# Patient Record
Sex: Female | Born: 1960 | Race: Black or African American | Hispanic: No | Marital: Single | Smoking: Never smoker
Health system: Southern US, Community
[De-identification: ages and names within clinical notes are randomized; demographics above are authoritative.]

## PROBLEM LIST (undated history)

## (undated) DIAGNOSIS — G629 Polyneuropathy, unspecified: Secondary | ICD-10-CM

## (undated) DIAGNOSIS — N6459 Other signs and symptoms in breast: Secondary | ICD-10-CM

## (undated) DIAGNOSIS — G2581 Restless legs syndrome: Secondary | ICD-10-CM

## (undated) DIAGNOSIS — I10 Essential (primary) hypertension: Secondary | ICD-10-CM

## (undated) DIAGNOSIS — M1990 Unspecified osteoarthritis, unspecified site: Secondary | ICD-10-CM

## (undated) DIAGNOSIS — D649 Anemia, unspecified: Secondary | ICD-10-CM

## (undated) DIAGNOSIS — K5900 Constipation, unspecified: Secondary | ICD-10-CM

## (undated) DIAGNOSIS — E119 Type 2 diabetes mellitus without complications: Secondary | ICD-10-CM

## (undated) HISTORY — PX: ESOPHAGOGASTRODUODENOSCOPY ENDOSCOPY: SHX5814

## (undated) HISTORY — PX: COLONOSCOPY: SHX174

## (undated) HISTORY — PX: BREAST EXCISIONAL BIOPSY: SUR124

---

## 1998-11-18 ENCOUNTER — Other Ambulatory Visit: Admission: RE | Admit: 1998-11-18 | Discharge: 1998-11-18 | Payer: Self-pay | Admitting: Gynecology

## 1999-06-18 ENCOUNTER — Emergency Department (HOSPITAL_COMMUNITY): Admission: EM | Admit: 1999-06-18 | Discharge: 1999-06-18 | Payer: Self-pay | Admitting: Emergency Medicine

## 2000-04-19 ENCOUNTER — Other Ambulatory Visit: Admission: RE | Admit: 2000-04-19 | Discharge: 2000-04-19 | Payer: Self-pay | Admitting: Gynecology

## 2000-06-13 ENCOUNTER — Encounter: Admission: RE | Admit: 2000-06-13 | Discharge: 2000-09-11 | Payer: Self-pay | Admitting: Endocrinology

## 2001-02-16 ENCOUNTER — Encounter: Admission: RE | Admit: 2001-02-16 | Discharge: 2001-05-17 | Payer: Self-pay | Admitting: Endocrinology

## 2003-08-23 ENCOUNTER — Other Ambulatory Visit: Admission: RE | Admit: 2003-08-23 | Discharge: 2003-08-23 | Payer: Self-pay | Admitting: Gynecology

## 2005-05-25 ENCOUNTER — Other Ambulatory Visit: Admission: RE | Admit: 2005-05-25 | Discharge: 2005-05-25 | Payer: Self-pay | Admitting: Gynecology

## 2008-04-09 ENCOUNTER — Other Ambulatory Visit: Admission: RE | Admit: 2008-04-09 | Discharge: 2008-04-09 | Payer: Self-pay | Admitting: Gynecology

## 2008-10-01 ENCOUNTER — Ambulatory Visit: Payer: Self-pay | Admitting: Gynecology

## 2011-09-28 ENCOUNTER — Other Ambulatory Visit: Payer: Self-pay | Admitting: Family Medicine

## 2011-09-28 DIAGNOSIS — Z1231 Encounter for screening mammogram for malignant neoplasm of breast: Secondary | ICD-10-CM

## 2011-10-19 ENCOUNTER — Ambulatory Visit
Admission: RE | Admit: 2011-10-19 | Discharge: 2011-10-19 | Disposition: A | Discharge status: Home / Self Care | Payer: Private Health Insurance - Indemnity | Source: Ambulatory Visit | Attending: Family Medicine | Admitting: Family Medicine

## 2011-10-19 DIAGNOSIS — Z1231 Encounter for screening mammogram for malignant neoplasm of breast: Secondary | ICD-10-CM

## 2011-11-30 ENCOUNTER — Ambulatory Visit (HOSPITAL_BASED_OUTPATIENT_CLINIC_OR_DEPARTMENT_OTHER)
Admission: RE | Admit: 2011-11-30 | Discharge: 2011-11-30 | Disposition: A | Discharge status: Home / Self Care | Payer: Private Health Insurance - Indemnity | Source: Ambulatory Visit | Attending: Family Medicine | Admitting: Family Medicine

## 2011-11-30 ENCOUNTER — Other Ambulatory Visit (HOSPITAL_BASED_OUTPATIENT_CLINIC_OR_DEPARTMENT_OTHER): Payer: Self-pay | Admitting: Family Medicine

## 2011-11-30 ENCOUNTER — Ambulatory Visit (INDEPENDENT_AMBULATORY_CARE_PROVIDER_SITE_OTHER)
Admission: RE | Admit: 2011-11-30 | Discharge: 2011-11-30 | Disposition: A | Discharge status: Home / Self Care | Payer: Private Health Insurance - Indemnity | Source: Ambulatory Visit | Attending: Family Medicine | Admitting: Family Medicine

## 2011-11-30 DIAGNOSIS — M2550 Pain in unspecified joint: Secondary | ICD-10-CM | POA: Insufficient documentation

## 2011-11-30 DIAGNOSIS — M25569 Pain in unspecified knee: Secondary | ICD-10-CM

## 2011-11-30 DIAGNOSIS — W19XXXA Unspecified fall, initial encounter: Secondary | ICD-10-CM

## 2012-09-06 ENCOUNTER — Other Ambulatory Visit: Payer: Self-pay | Admitting: Family Medicine

## 2012-09-06 DIAGNOSIS — Z1231 Encounter for screening mammogram for malignant neoplasm of breast: Secondary | ICD-10-CM

## 2012-11-06 ENCOUNTER — Ambulatory Visit
Admission: RE | Admit: 2012-11-06 | Discharge: 2012-11-06 | Disposition: A | Discharge status: Home / Self Care | Payer: Private Health Insurance - Indemnity | Source: Ambulatory Visit | Attending: Family Medicine | Admitting: Family Medicine

## 2012-11-06 DIAGNOSIS — Z1231 Encounter for screening mammogram for malignant neoplasm of breast: Secondary | ICD-10-CM

## 2013-01-20 HISTORY — PX: EYE SURGERY: SHX253

## 2013-08-20 HISTORY — PX: EYE SURGERY: SHX253

## 2013-09-13 ENCOUNTER — Other Ambulatory Visit: Payer: Self-pay

## 2013-09-13 DIAGNOSIS — Z1231 Encounter for screening mammogram for malignant neoplasm of breast: Secondary | ICD-10-CM

## 2013-11-07 ENCOUNTER — Ambulatory Visit
Admission: RE | Admit: 2013-11-07 | Discharge: 2013-11-07 | Disposition: A | Discharge status: Home / Self Care | Payer: Private Health Insurance - Indemnity | Source: Ambulatory Visit

## 2013-11-07 DIAGNOSIS — Z1231 Encounter for screening mammogram for malignant neoplasm of breast: Secondary | ICD-10-CM

## 2014-10-10 ENCOUNTER — Other Ambulatory Visit: Payer: Self-pay

## 2014-10-10 DIAGNOSIS — Z1231 Encounter for screening mammogram for malignant neoplasm of breast: Secondary | ICD-10-CM

## 2014-11-08 ENCOUNTER — Ambulatory Visit
Admission: RE | Admit: 2014-11-08 | Discharge: 2014-11-08 | Disposition: A | Discharge status: Home / Self Care | Payer: Managed Care, Other (non HMO) | Source: Ambulatory Visit

## 2014-11-08 DIAGNOSIS — Z1231 Encounter for screening mammogram for malignant neoplasm of breast: Secondary | ICD-10-CM

## 2015-12-16 ENCOUNTER — Other Ambulatory Visit: Payer: Self-pay

## 2015-12-16 DIAGNOSIS — Z1231 Encounter for screening mammogram for malignant neoplasm of breast: Secondary | ICD-10-CM

## 2015-12-31 ENCOUNTER — Ambulatory Visit
Admission: RE | Admit: 2015-12-31 | Discharge: 2015-12-31 | Disposition: A | Discharge status: Home / Self Care | Payer: BLUE CROSS/BLUE SHIELD | Source: Ambulatory Visit

## 2015-12-31 DIAGNOSIS — Z1231 Encounter for screening mammogram for malignant neoplasm of breast: Secondary | ICD-10-CM

## 2016-01-01 ENCOUNTER — Other Ambulatory Visit: Payer: Self-pay | Admitting: Family Medicine

## 2016-01-01 ENCOUNTER — Other Ambulatory Visit: Payer: Self-pay

## 2016-01-01 DIAGNOSIS — R928 Other abnormal and inconclusive findings on diagnostic imaging of breast: Secondary | ICD-10-CM

## 2016-01-07 ENCOUNTER — Other Ambulatory Visit: Payer: Self-pay | Admitting: Family Medicine

## 2016-01-07 ENCOUNTER — Ambulatory Visit
Admission: RE | Admit: 2016-01-07 | Discharge: 2016-01-07 | Disposition: A | Discharge status: Home / Self Care | Payer: No Typology Code available for payment source | Source: Ambulatory Visit | Attending: Family Medicine | Admitting: Family Medicine

## 2016-01-07 DIAGNOSIS — R928 Other abnormal and inconclusive findings on diagnostic imaging of breast: Secondary | ICD-10-CM

## 2016-01-07 DIAGNOSIS — N6320 Unspecified lump in the left breast, unspecified quadrant: Secondary | ICD-10-CM

## 2016-01-08 ENCOUNTER — Ambulatory Visit
Admission: RE | Admit: 2016-01-08 | Discharge: 2016-01-08 | Disposition: A | Discharge status: Home / Self Care | Payer: No Typology Code available for payment source | Source: Ambulatory Visit | Attending: Family Medicine | Admitting: Family Medicine

## 2016-01-08 ENCOUNTER — Other Ambulatory Visit: Payer: Self-pay | Admitting: Family Medicine

## 2016-01-08 DIAGNOSIS — N6320 Unspecified lump in the left breast, unspecified quadrant: Secondary | ICD-10-CM

## 2016-01-08 HISTORY — PX: BREAST BIOPSY: SHX20

## 2017-01-31 ENCOUNTER — Other Ambulatory Visit: Payer: Self-pay | Admitting: Family Medicine

## 2017-01-31 ENCOUNTER — Other Ambulatory Visit: Payer: Self-pay | Admitting: Oncology

## 2017-01-31 DIAGNOSIS — N63 Unspecified lump in unspecified breast: Secondary | ICD-10-CM

## 2017-02-03 ENCOUNTER — Ambulatory Visit
Admission: RE | Admit: 2017-02-03 | Discharge: 2017-02-03 | Disposition: A | Discharge status: Home / Self Care | Payer: Managed Care, Other (non HMO) | Source: Ambulatory Visit | Attending: Oncology | Admitting: Oncology

## 2017-02-03 ENCOUNTER — Ambulatory Visit
Admission: RE | Admit: 2017-02-03 | Discharge: 2017-02-03 | Disposition: A | Discharge status: Home / Self Care | Payer: Self-pay | Source: Ambulatory Visit | Attending: Oncology | Admitting: Oncology

## 2017-02-03 DIAGNOSIS — N63 Unspecified lump in unspecified breast: Secondary | ICD-10-CM

## 2017-02-11 ENCOUNTER — Ambulatory Visit: Payer: Self-pay | Admitting: Surgery

## 2017-02-11 DIAGNOSIS — N6320 Unspecified lump in the left breast, unspecified quadrant: Secondary | ICD-10-CM

## 2017-02-11 NOTE — H&P (Signed)
History of Present Illness Tricia Nelson. Tricia Hoopes MD; 02/11/2017 10:56 AM) The patient is a 56 year old female who presents with a breast mass. Referred by Tricia Castle, NP for left breast mass  This is a 57 year old female with a family history of breast cancer in her mother who passed away at age 67 who presents with a palpable mass in her upper inner quadrant of her left breast. January 2017, the patient underwent routine screening mammogram. This showed a suspicious area in the upper inner quadrant of her left breast. She underwent further workup with mammogram and ultrasound as well as biopsy showing fibrocystic changes and chronic inflammation. However, follow-up mammogram on 02/03/17 showed that this mass is larger and has become more spiculated. A biopsy clip is in place. She presents now to discuss excision.   CLINICAL DATA: Screening.  EXAM: DIGITAL SCREENING BILATERAL MAMMOGRAM WITH CAD  COMPARISON: Previous exam(s).  ACR Breast Density Category c: The breast tissue is heterogeneously dense, which may obscure small masses.  FINDINGS: In the left breast, a possible mass warrants further evaluation. In the right breast, no findings suspicious for malignancy.  Images were processed with CAD.  IMPRESSION: Further evaluation is suggested for possible mass in the left breast.  RECOMMENDATION: Diagnostic mammogram and possibly ultrasound of the left breast. (Code:[id number])  The patient will be contacted regarding the findings, and additional imaging will be scheduled.  BI-RADS CATEGORY 0: Incomplete. Need additional imaging evaluation and/or prior mammograms for comparison.   Electronically Signed By: Abelardo Diesel M.D. On: 12/31/2015 13:45  CLINICAL DATA: Recall from screening mammography. The patient does have a family history of postmenopausal breast cancer in her mother (now deceased from breast cancer).  EXAM: DIGITAL DIAGNOSTIC LEFT MAMMOGRAM WITH 3D  TOMOSYNTHESIS WITH CAD  ULTRASOUND LEFT BREAST  COMPARISON: Previous exam(s).  ACR Breast Density Category c: The breast tissue is heterogeneously dense, which may obscure small masses.  FINDINGS: There is a subtle area of developing density located within the upper inner quadrant of the left breast with possible subtle distortion. The mass is located within the posterior 1/3 of the left breast at the 11 o'clock position approximately 7 cm from the nipple.  Mammographic images were processed with CAD.  On physical exam, there is firm thickening located within the upper inner quadrant of the left breast. There is no palpable left axillary adenopathy.  Targeted ultrasound is performed, showing a subtle, irregular hypoechoic mass with shadowing and possible distortion located within the left breast at the 11 o'clock position 7 cm from the nipple measuring 2.3 x 1.9 x 1.5 cm in size. Tissue sampling is recommended. I have discussed ultrasound-guided core biopsy of this area. This will be scheduled per patient preference.  Ultrasound of the left axilla demonstrates normal axillary contents and no evidence for adenopathy. Also, ultrasound of the left internal mammary chain region demonstrates no adenopathy.  IMPRESSION: 2.3 cm irregular mass corresponding to an area of subtle developing density with possible distortion located within the left breast at the 11 o'clock position 7 cm from the nipple. Tissue sampling is recommended and ultrasound-guided core biopsy will be scheduled.  RECOMMENDATION: Left breast ultrasound-guided core biopsy.  I have discussed the findings and recommendations with the patient. Results were also provided in writing at the conclusion of the visit. If applicable, a reminder letter will be sent to the patient regarding the next appointment.  BI-RADS CATEGORY 4: Suspicious.   Electronically Signed By: Altamese Cabal M.D. On: 01/07/2016  10:01  CLINICAL DATA: Palpable mass 11 o'clock position left breast 7 cm from the nipple. Suspicious features on ultrasound.  EXAM: ULTRASOUND GUIDED LEFT BREAST CORE NEEDLE BIOPSY  COMPARISON: 01/07/2016  PROCEDURE: I met with the patient and we discussed the procedure of ultrasound-guided biopsy, including benefits and alternatives. We discussed the high likelihood of a successful procedure. We discussed the risks of the procedure including infection, bleeding, tissue injury, clip migration, and inadequate sampling. Informed written consent was given. The usual time-out protocol was performed immediately prior to the procedure.  Using sterile technique and 1% Lidocaine as local anesthetic, under direct ultrasound visualization, a 12 gauge vacuum-assisted device was used to perform biopsy of an irregular hypoechoic mass at 11 o'clock position left breast using a medial to lateral approach. At the conclusion of the procedure, a ribbon shaped tissue marker clip was deployed into the biopsy cavity. Follow-up 2-view mammogram was performed and dictated separately.  IMPRESSION: Ultrasound-guided biopsy of left breast mass. No apparent complications.  Electronically Signed: By: Susan Turner M.D. On: 01/08/2016 16:30  ADDENDUM REPORT: 01/09/2016 11:13 ADDENDUM: Pathology revealed fibrocystic changes with chronic inflammation and fibrosis in the left breast. This was found to be concordant by Dr. Susan Turner. Pathology was discussed with the patient by telephone. She reported doing well after the biopsy. Post biopsy instructions and care were reviewed and her questions were answered. She was encouraged to call [facility] for any additional concerns. She was asked to return for left diagnostic mammography and possible ultrasound in 6 months. Pathology results reported by Leigh Kuhnly RN, BSN on January 09, 2016. Electronically Signed By: Susan  Turner M.D. On: 01/09/2016 11:13  CLINICAL DATA: Ultrasound-guided biopsy was performed of the left breast.  EXAM: DIAGNOSTIC LEFT MAMMOGRAM POST ULTRASOUND BIOPSY  COMPARISON: Previous exam(s).  FINDINGS: Mammographic images were obtained following ultrasound guided biopsy of a left breast mass in the upper inner. A ribbon shaped biopsy clip is satisfactorily positioned within the developing density of the upper inner left breast.  IMPRESSION: Satisfactory position of ribbon shaped biopsy clip in the left breast.  Final Assessment: Post Procedure Mammograms for Marker Placement   Electronically Signed By: Susan Turner M.D. On: 01/08/2016 16:31  CLINICAL DATA: Followup after benign ultrasound guided biopsy of a left breast mass with pathology revealing inflammation and fibrosis. The patient states there is a persistent palpable mass in the upper inner left breast which she feels has increased in size over time.  EXAM: 2D DIGITAL DIAGNOSTIC BILATERAL MAMMOGRAM WITH CAD AND ADJUNCT TOMO  COMPARISON: Previous exam(s).  ACR Breast Density Category c: The breast tissue is heterogeneously dense, which may obscure small masses.  FINDINGS: No suspicious masses or calcifications are seen in the right breast. A ribbon shaped biopsy marking clip is present at site of prior benign left breast biopsy. The mass in the upper inner left breast appears irregular and now spiculated in appearance measuring approximately 1.8 cm.  Mammographic images were processed with CAD.  Physical examination of the upper inner left breast reveals a firm palpable mass at the approximate 11 o'clock position.  IMPRESSION: Left breast mass at 11 o'clock previously biopsied with pathology demonstrating fibrosis and inflammation. Given the irregular spiculated appearance of this mass, surgical excision is recommended.  RECOMMENDATION: Recommend surgical excision of the mass in the upper  inner posterior left breast at site of ribbon shaped biopsy marking clip. Surgical consultation will be arranged for the patient.  I have discussed the findings and recommendations   with the patient. Results were also provided in writing at the conclusion of the visit. If applicable, a reminder letter will be sent to the patient regarding the next appointment.  BI-RADS CATEGORY 0: Incomplete. Need additional imaging evaluation and/or prior mammograms for comparison.   Electronically Signed By: Everlean Alstrom M.D. On: 02/03/2017 08:37    Past Surgical History Patsey Berthold, [place]; 02/11/2017 10:03 AM) Breast Biopsy Left. Cataract Surgery Bilateral.  Diagnostic Studies History Patsey Berthold, [place]; 02/11/2017 10:03 AM) Colonoscopy 1-5 years ago Mammogram within last year Pap Smear >5 years ago  Allergies Patsey Berthold, [place]; 02/11/2017 10:04 AM) No Known Drug Allergies 02/11/2017  Medication History Patsey Berthold, CMA; 02/11/2017 10:04 AM) Carlyn Reichert (50-1000MG Tablet, Oral) Active. Jardiance (25MG Tablet, Oral) Active. Simvastatin (20MG Tablet, Oral) Active. Lisinopril (10MG Tablet, Oral) Active. Medications Reconciled  Social History Patsey Berthold, CMA; 02/11/2017 10:04 AM) Alcohol use Occasional alcohol use. Caffeine use Coffee. No drug use Tobacco use Never smoker.  Family History Patsey Berthold, [place]; 02/11/2017 10:04 AM) Alcohol Abuse Family Members In General, Father. Breast Cancer Mother. Colon Cancer Family Members In General. Depression Sister. Diabetes Mellitus Father, Sister. Heart Disease Family Members In General. Hypertension Family Members In General, Mother.  Pregnancy / Birth History Patsey Berthold, [place]; 02/11/2017 10:04 AM) Age at menarche 71 years. Contraceptive History Oral contraceptives. Gravida 3 Irregular periods Maternal age 83-20  Other Problems Patsey Berthold, [place]; 02/11/2017 10:04 AM) Diabetes  [place] Gastroesophageal Reflux Disease High blood pressure     Review of Systems Patsey Berthold CMA; 02/11/2017 10:04 AM) General Not Present- Appetite Loss, Chills, Fatigue, Fever, Night Sweats, Weight Gain and Weight Loss. Skin Not Present- Change in Wart/Mole, Dryness, Hives, Jaundice, New Lesions, Non-Healing Wounds, Rash and Ulcer. HEENT Present- Wears glasses/contact lenses. Not Present- Earache, Hearing Loss, Hoarseness, Nose Bleed, Oral Ulcers, Ringing in the Ears, Seasonal Allergies, Sinus Pain, Sore Throat, Visual Disturbances and Yellow Eyes. Respiratory Not Present- Bloody sputum, Chronic Cough, Difficulty Breathing, Snoring and Wheezing. Breast Present- Breast Mass. Not Present- Breast Pain, Nipple Discharge and Skin Changes. Cardiovascular Not Present- Chest Pain, Difficulty Breathing Lying Down, Leg Cramps, Palpitations, Rapid Heart Rate, Shortness of Breath and Swelling of Extremities. Gastrointestinal Present- Difficulty Swallowing and Gets full quickly at meals. Not Present- Abdominal Pain, Bloating, Bloody Stool, Change in Bowel Habits, Chronic diarrhea, Constipation, Excessive gas, Hemorrhoids, Indigestion, Nausea, Rectal Pain and Vomiting. Female Genitourinary Not Present- Frequency, Nocturia, Painful Urination, Pelvic Pain and Urgency. Musculoskeletal Present- Back Pain. Not Present- Joint Pain, Joint Stiffness, Muscle Pain, Muscle Weakness and Swelling of Extremities. Psychiatric Not Present- Anxiety, Bipolar, Change in Sleep Pattern, Depression, Fearful and Frequent crying. Endocrine Not Present- Cold Intolerance, Excessive Hunger, Hair Changes, Heat Intolerance, Hot flashes and New Diabetes. Hematology Not Present- Blood Thinners, Easy Bruising, Excessive bleeding, Gland problems, HIV and Persistent Infections.  Vitals Patsey Berthold CMA; 02/11/2017 10:05 AM) 02/11/2017 10:04 AM Weight: 187.4 lb Height: 64in Height was reported by patient. Body Surface Area:  1.9 m Body Mass Index: 32.17 kg/m  Temp.: 98.62F  Pulse: 84 (Regular)  BP: 134/78 (Sitting, Left Arm, Standard)      Physical Exam Rodman Key K. Gavyn Zoss MD; 02/11/2017 10:58 AM)  The physical exam findings are as follows: Note:WDWN in NAD Eyes: Pupils equal, round; sclera anicteric HENT: Oral mucosa moist; good dentition Neck: No masses palpated, no thyromegaly Lungs: CTA bilaterally; normal respiratory effort Breasts: symmetric; bilateral retracted nipples - chronic No axillary lymphadenopathy; no right breast masses Left upper inner quadrant - indistinct firmness about 5-7 cm  from edge of nipple at 10-11:00 CV: Regular rate and rhythm; no murmurs; extremities well-perfused with no edema Abd: +bowel sounds, soft, non-tender, no palpable organomegaly; no palpable hernias Skin: Warm, dry; no sign of jaundice Psychiatric - alert and oriented x 4; calm mood and affect    Assessment & Plan Rodman Key K. Romar Woodrick MD; 02/11/2017 10:59 AM)  MASS OF UPPER INNER QUADRANT OF LEFT BREAST (N63.22)  Current Plans Pt Education - [facility] Breast Cancer Information Given - [facility] "Breast Journey" Package Schedule for Surgery - Left radioactive seed localized lumpectomy. The surgical procedure has been discussed with the patient. Potential risks, benefits, alternative treatments, and expected outcomes have been explained. All of the patient's questions at this time have been answered. The likelihood of reaching the patient's treatment goal is good. The patient understand the proposed surgical procedure and wishes to proceed. Note:She did not undergo a repeat core biopsy as the previous biopsy was negative. A negative biopsy at this time would not change our surgical plan other than the possibility of sentinel lymph node biopsy.  I spent approximately 30 minutes of additional time with the patient discussing her surgical options. We discussed the usual pathway of a preoperative core biopsy to  help plan lumpectomy with or without sentinel lymph node biopsy. However, with a previous biopsy that was negative for malignancy, a negative repeat biopsy would not change our plan to excise this area. She understands and wishes to proceed with radioactive seed localization lumpectomy.  Tricia Nelson. Georgette Dover, MD, [facility]  General/ Trauma Surgery  02/11/2017 11:00 AM

## 2017-02-16 ENCOUNTER — Other Ambulatory Visit: Payer: Self-pay | Admitting: Surgery

## 2017-02-16 DIAGNOSIS — N6320 Unspecified lump in the left breast, unspecified quadrant: Secondary | ICD-10-CM

## 2017-02-17 ENCOUNTER — Other Ambulatory Visit (HOSPITAL_COMMUNITY): Payer: Self-pay | Admitting: *Deleted

## 2017-02-17 NOTE — Pre-Procedure Instructions (Addendum)
Tricia Nelson  02/17/2017    Your procedure is scheduled on Tuesday, February 22, 2017 at 9:15 AM.   Report to [facility] Entrance "A" Admitting Office at 7:15 AM.   Call this number if you have problems the morning of surgery: [phone]   Questions prior to day of surgery, please call [phone] between 8 & 4 PM.   Remember:  Do not eat food or drink liquids after midnight Monday, 02/21/17.  You may use your eye drops the morning of surgery. Stop the Biotin with Vit C and E as of today. Do not use Aspirin products or NSAIDS (Ibuprofen, Aleve, etc) prior to surgery.  Drink the 8 ounce water 2 hours prior to your arrival time day of surgery. Drink it at 5:15 AM.    How to Manage Your Diabetes Before Surgery   Why is it important to control my blood sugar before and after surgery?   Improving blood sugar levels before and after surgery helps healing and can limit problems.  A way of improving blood sugar control is eating a healthy diet by:  - Eating less sugar and carbohydrates  - Increasing activity/exercise  - Talk with your doctor about reaching your blood sugar goals  High blood sugars (greater than 180 mg/dL) can raise your risk of infections and slow down your recovery so you will need to focus on controlling your diabetes during the weeks before surgery.  Make sure that the doctor who takes care of your diabetes knows about your planned surgery including the date and location.  How do I manage my blood sugars before surgery?   Check your blood sugar at least 4 times a day, 2 days before surgery to make sure that they are not too high or low.  Check your blood sugar the morning of your surgery when you wake up and every 2 hours until you get to the Short-Stay unit.  Treat a low blood sugar (less than 70 mg/dL) with 1/2 cup of clear juice (cranberry or apple), 4 glucose tablets, OR glucose gel.  Recheck blood sugar in 15 minutes after treatment (to make  sure it is greater than 70 mg/dL).  If blood sugar is not greater than 70 mg/dL on re-check, call [phone] for further instructions.   Report your blood sugar to the Short-Stay nurse when you get to Short-Stay.  References:  [facility], 2007 "How to Manage your Diabetes Before and After Surgery".  What do I do about my diabetes medications?   Do not take oral diabetes medicines (pills) the morning of surgery.   Do not wear jewelry, make-up or nail polish.  Do not wear lotions, powders, perfumes or deodorant.  Do not shave 48 hours prior to surgery.    Do not bring valuables to the hospital.  [facility] is not responsible for any belongings or valuables.  Contacts, dentures or bridgework may not be worn into surgery.  Leave your suitcase in the car.  After surgery it may be brought to your room.  For patients admitted to the hospital, discharge time will be determined by your treatment team.  Patients discharged the day of surgery will not be allowed to drive home.   Special instructions:  [place] - Preparing for Surgery  Before surgery, you can play an important role.  Because skin is not sterile, your skin needs to be as free of germs as possible.  You can reduce the number of  germs on you skin by washing with CHG (chlorahexidine gluconate) soap before surgery.  CHG is an antiseptic cleaner which kills germs and bonds with the skin to continue killing germs even after washing.  Please DO NOT use if you have an allergy to CHG or antibacterial soaps.  If your skin becomes reddened/irritated stop using the CHG and inform your nurse when you arrive at Short Stay.  Do not shave (including legs and underarms) for at least 48 hours prior to the first CHG shower.  You may shave your face.  Please follow these instructions carefully:   1.  Shower with CHG Soap the night before surgery and the                    morning of Surgery.  2.  If you choose to  wash your hair, wash your hair first as usual with your       normal shampoo.  3.  After you shampoo, rinse your hair and body thoroughly to remove the shampoo.  4.  Use CHG as you would any other liquid soap.  You can apply chg directly       to the skin and wash gently with scrungie or a clean washcloth.  5.  Apply the CHG Soap to your body ONLY FROM THE NECK DOWN.        Do not use on open wounds or open sores.  Avoid contact with your eyes, ears, mouth and genitals (private parts).  Wash genitals (private parts) with your normal soap.  6.  Wash thoroughly, paying special attention to the area where your surgery        will be performed.  7.  Thoroughly rinse your body with warm water from the neck down.  8.  DO NOT shower/wash with your normal soap after using and rinsing off       the CHG Soap.  9.  Pat yourself dry with a clean towel.            10.  Wear clean pajamas.            11.  Place clean sheets on your bed the night of your first shower and do not        sleep with pets.  Day of Surgery  Do not apply any lotions/deodorants the morning of surgery.  Please wear clean clothes to the hospital.   Please read over the fact sheets that you were given.

## 2017-02-18 ENCOUNTER — Ambulatory Visit
Admission: RE | Admit: 2017-02-18 | Discharge: 2017-02-18 | Disposition: A | Discharge status: Home / Self Care | Payer: 59 | Source: Ambulatory Visit | Attending: Surgery | Admitting: Surgery

## 2017-02-18 ENCOUNTER — Encounter (HOSPITAL_COMMUNITY): Payer: Self-pay | Admitting: *Deleted

## 2017-02-18 ENCOUNTER — Encounter (HOSPITAL_COMMUNITY)
Admission: RE | Admit: 2017-02-18 | Discharge: 2017-02-18 | Disposition: A | Discharge status: Home / Self Care | Payer: 59 | Source: Ambulatory Visit | Attending: Surgery | Admitting: Surgery

## 2017-02-18 DIAGNOSIS — N6320 Unspecified lump in the left breast, unspecified quadrant: Secondary | ICD-10-CM

## 2017-02-18 DIAGNOSIS — E119 Type 2 diabetes mellitus without complications: Secondary | ICD-10-CM | POA: Diagnosis not present

## 2017-02-18 DIAGNOSIS — I10 Essential (primary) hypertension: Secondary | ICD-10-CM | POA: Diagnosis not present

## 2017-02-18 DIAGNOSIS — Z79899 Other long term (current) drug therapy: Secondary | ICD-10-CM | POA: Insufficient documentation

## 2017-02-18 DIAGNOSIS — D649 Anemia, unspecified: Secondary | ICD-10-CM | POA: Diagnosis not present

## 2017-02-18 DIAGNOSIS — Z01812 Encounter for preprocedural laboratory examination: Secondary | ICD-10-CM | POA: Insufficient documentation

## 2017-02-18 DIAGNOSIS — Z01810 Encounter for preprocedural cardiovascular examination: Secondary | ICD-10-CM | POA: Diagnosis not present

## 2017-02-18 HISTORY — DX: Restless legs syndrome: G25.81

## 2017-02-18 HISTORY — DX: Unspecified osteoarthritis, unspecified site: M19.90

## 2017-02-18 HISTORY — DX: Polyneuropathy, unspecified: G62.9

## 2017-02-18 HISTORY — DX: Essential (primary) hypertension: I10

## 2017-02-18 HISTORY — DX: Constipation, unspecified: K59.00

## 2017-02-18 HISTORY — DX: Type 2 diabetes mellitus without complications: E11.9

## 2017-02-18 HISTORY — DX: Anemia, unspecified: D64.9

## 2017-02-18 LAB — CBC
HCT: 36.8 % (ref 36.0–46.0)
Hemoglobin: 11.8 g/dL — ABNORMAL LOW (ref 12.0–15.0)
MCH: 25.4 pg — AB (ref 26.0–34.0)
MCHC: 32.1 g/dL (ref 30.0–36.0)
MCV: 79.3 fL (ref 78.0–100.0)
PLATELETS: 264 10*3/uL (ref 150–400)
RBC: 4.64 MIL/uL (ref 3.87–5.11)
RDW: 15.1 % (ref 11.5–15.5)
WBC: 5.7 10*3/uL (ref 4.0–10.5)

## 2017-02-18 LAB — BASIC METABOLIC PANEL
Anion gap: 8 (ref 5–15)
BUN: 15 mg/dL (ref 6–20)
CALCIUM: 9.3 mg/dL (ref 8.9–10.3)
CO2: 24 mmol/L (ref 22–32)
CREATININE: 1.03 mg/dL — AB (ref 0.44–1.00)
Chloride: 108 mmol/L (ref 101–111)
GFR calc non Af Amer: >60 mL/min (ref >60)
Glucose, Bld: 143 mg/dL — ABNORMAL HIGH (ref 65–99)
Potassium: 5.3 mmol/L — ABNORMAL HIGH (ref 3.5–5.1)
Sodium: 140 mmol/L (ref 135–145)

## 2017-02-18 LAB — GLUCOSE, CAPILLARY: GLUCOSE-CAPILLARY: 131 mg/dL — AB (ref 65–99)

## 2017-02-18 NOTE — Progress Notes (Signed)
Pt denies cardiac history, chest pain or sob. Pt is a diabetic, type 2. Pt's last A1C was 7.7 on 10/22/16. Will have one done today. States fasting blood sugars are usually between 150-170. Today's CBG was fasting and was 131. Pt given 8 oz bottle of water for ERAS.

## 2017-02-19 LAB — HEMOGLOBIN A1C
Hgb A1c MFr Bld: 7.9 % — ABNORMAL HIGH (ref 4.8–5.6)
MEAN PLASMA GLUCOSE: 180 mg/dL

## 2017-02-21 NOTE — Progress Notes (Signed)
Anesthesia Chart Review:  Pt is a 56 year old female scheduled for L breast lumpectomy with radioactive seed localization on 02/22/2017 with Manus RuddMatthew Tsuei, MD.   PMH includes:  HTN, DM, anemia. Never smoker. BMI 32  Medications include: janumet, jardiance, lisinopril, simvastatin  Preoperative labs reviewed.   - K 5.3, but specimen hemolyzed. Will repeat DOS.  - HbA1c 7.9, glucose 143  EKG 02/18/17: NSR. Possible LAE.   If labs acceptable DOS, I anticipate pt can proceed as scheduled.   Rica Mastngela Kabbe, FNP-BC [facility]iology Phone: [phone] 02/21/2017 11:05 AM

## 2017-02-22 ENCOUNTER — Ambulatory Visit (HOSPITAL_COMMUNITY): Payer: 59 | Admitting: Emergency Medicine

## 2017-02-22 ENCOUNTER — Encounter (HOSPITAL_COMMUNITY): Payer: Self-pay | Admitting: *Deleted

## 2017-02-22 ENCOUNTER — Ambulatory Visit
Admission: RE | Admit: 2017-02-22 | Discharge: 2017-02-22 | Disposition: A | Discharge status: Home / Self Care | Payer: 59 | Source: Ambulatory Visit | Attending: Surgery | Admitting: Surgery

## 2017-02-22 ENCOUNTER — Ambulatory Visit (HOSPITAL_COMMUNITY)
Admission: RE | Admit: 2017-02-22 | Discharge: 2017-02-22 | Disposition: A | Discharge status: Home / Self Care | Payer: 59 | Source: Ambulatory Visit | Attending: Surgery | Admitting: Surgery

## 2017-02-22 ENCOUNTER — Encounter (HOSPITAL_COMMUNITY)
Admission: RE | Disposition: A | Discharge status: Home / Self Care | Payer: Self-pay | Source: Ambulatory Visit | Attending: Surgery

## 2017-02-22 DIAGNOSIS — Z9842 Cataract extraction status, left eye: Secondary | ICD-10-CM | POA: Diagnosis not present

## 2017-02-22 DIAGNOSIS — Z803 Family history of malignant neoplasm of breast: Secondary | ICD-10-CM | POA: Diagnosis not present

## 2017-02-22 DIAGNOSIS — N6320 Unspecified lump in the left breast, unspecified quadrant: Secondary | ICD-10-CM | POA: Diagnosis present

## 2017-02-22 DIAGNOSIS — Z79899 Other long term (current) drug therapy: Secondary | ICD-10-CM | POA: Insufficient documentation

## 2017-02-22 DIAGNOSIS — N6322 Unspecified lump in the left breast, upper inner quadrant: Secondary | ICD-10-CM | POA: Diagnosis not present

## 2017-02-22 DIAGNOSIS — I10 Essential (primary) hypertension: Secondary | ICD-10-CM | POA: Insufficient documentation

## 2017-02-22 DIAGNOSIS — Z833 Family history of diabetes mellitus: Secondary | ICD-10-CM | POA: Insufficient documentation

## 2017-02-22 DIAGNOSIS — Z8249 Family history of ischemic heart disease and other diseases of the circulatory system: Secondary | ICD-10-CM | POA: Insufficient documentation

## 2017-02-22 DIAGNOSIS — N6012 Diffuse cystic mastopathy of left breast: Secondary | ICD-10-CM | POA: Diagnosis not present

## 2017-02-22 DIAGNOSIS — K219 Gastro-esophageal reflux disease without esophagitis: Secondary | ICD-10-CM | POA: Insufficient documentation

## 2017-02-22 DIAGNOSIS — Z818 Family history of other mental and behavioral disorders: Secondary | ICD-10-CM | POA: Diagnosis not present

## 2017-02-22 DIAGNOSIS — E119 Type 2 diabetes mellitus without complications: Secondary | ICD-10-CM | POA: Diagnosis not present

## 2017-02-22 DIAGNOSIS — Z800 Family history of malignant neoplasm of digestive organs: Secondary | ICD-10-CM | POA: Diagnosis not present

## 2017-02-22 DIAGNOSIS — Z9841 Cataract extraction status, right eye: Secondary | ICD-10-CM | POA: Diagnosis not present

## 2017-02-22 DIAGNOSIS — Z811 Family history of alcohol abuse and dependence: Secondary | ICD-10-CM | POA: Diagnosis not present

## 2017-02-22 HISTORY — PX: BREAST EXCISIONAL BIOPSY: SUR124

## 2017-02-22 HISTORY — PX: BREAST LUMPECTOMY WITH RADIOACTIVE SEED LOCALIZATION: SHX6424

## 2017-02-22 LAB — POCT I-STAT 4, (NA,K, GLUC, HGB,HCT)
Glucose, Bld: 151 mg/dL — ABNORMAL HIGH (ref 65–99)
HEMATOCRIT: 38 % (ref 36.0–46.0)
Hemoglobin: 12.9 g/dL (ref 12.0–15.0)
Potassium: 4.2 mmol/L (ref 3.5–5.1)
Sodium: 142 mmol/L (ref 135–145)

## 2017-02-22 LAB — GLUCOSE, CAPILLARY
GLUCOSE-CAPILLARY: 186 mg/dL — AB (ref 65–99)
Glucose-Capillary: 140 mg/dL — ABNORMAL HIGH (ref 65–99)

## 2017-02-22 SURGERY — BREAST LUMPECTOMY WITH RADIOACTIVE SEED LOCALIZATION
Anesthesia: General | Site: Breast | Laterality: Left

## 2017-02-22 MED ORDER — ONDANSETRON HCL 4 MG/2ML IJ SOLN
INTRAMUSCULAR | Status: DC | PRN
  Administered 2017-02-22: 4 mg via INTRAVENOUS

## 2017-02-22 MED ORDER — 0.9 % SODIUM CHLORIDE (POUR BTL) OPTIME
TOPICAL | Status: DC | PRN
  Administered 2017-02-22: 1000 mL

## 2017-02-22 MED ORDER — DEXAMETHASONE SODIUM PHOSPHATE 10 MG/ML IJ SOLN
INTRAMUSCULAR | Status: AC
  Filled 2017-02-22: qty 1

## 2017-02-22 MED ORDER — PHENYLEPHRINE 40 MCG/ML (10ML) SYRINGE FOR IV PUSH (FOR BLOOD PRESSURE SUPPORT)
PREFILLED_SYRINGE | INTRAVENOUS | Status: AC
  Filled 2017-02-22: qty 10

## 2017-02-22 MED ORDER — LACTATED RINGERS IV SOLN
Freq: Once | INTRAVENOUS | Status: AC
  Administered 2017-02-22: 08:00:00 via INTRAVENOUS

## 2017-02-22 MED ORDER — DEXAMETHASONE SODIUM PHOSPHATE 10 MG/ML IJ SOLN
INTRAMUSCULAR | Status: DC | PRN
  Administered 2017-02-22: 5 mg via INTRAVENOUS

## 2017-02-22 MED ORDER — LIDOCAINE HCL (CARDIAC) 20 MG/ML IV SOLN
INTRAVENOUS | Status: DC | PRN
  Administered 2017-02-22: 100 mg via INTRAVENOUS

## 2017-02-22 MED ORDER — DIPHENHYDRAMINE HCL 50 MG/ML IJ SOLN
INTRAMUSCULAR | Status: AC
  Filled 2017-02-22: qty 1

## 2017-02-22 MED ORDER — SUGAMMADEX SODIUM 200 MG/2ML IV SOLN
INTRAVENOUS | Status: DC | PRN
  Administered 2017-02-22: 180 mg via INTRAVENOUS

## 2017-02-22 MED ORDER — KETOROLAC TROMETHAMINE 30 MG/ML IJ SOLN
INTRAMUSCULAR | Status: AC
  Filled 2017-02-22: qty 1

## 2017-02-22 MED ORDER — PHENYLEPHRINE HCL 10 MG/ML IJ SOLN
INTRAMUSCULAR | Status: DC | PRN
  Administered 2017-02-22 (×3): 80 ug via INTRAVENOUS

## 2017-02-22 MED ORDER — FENTANYL CITRATE (PF) 100 MCG/2ML IJ SOLN
50.0000 ug | Freq: Once | INTRAMUSCULAR | Status: AC
  Administered 2017-02-22: 50 ug via INTRAVENOUS

## 2017-02-22 MED ORDER — SUGAMMADEX SODIUM 200 MG/2ML IV SOLN
INTRAVENOUS | Status: AC
  Filled 2017-02-22: qty 2

## 2017-02-22 MED ORDER — MIDAZOLAM HCL 2 MG/2ML IJ SOLN
INTRAMUSCULAR | Status: AC
  Filled 2017-02-22: qty 2

## 2017-02-22 MED ORDER — HYDROMORPHONE HCL 1 MG/ML IJ SOLN: 0.2500 mg | INTRAMUSCULAR | Status: DC | PRN

## 2017-02-22 MED ORDER — CHLORHEXIDINE GLUCONATE CLOTH 2 % EX PADS: 6.0000 | MEDICATED_PAD | Freq: Once | CUTANEOUS | Status: DC

## 2017-02-22 MED ORDER — CELECOXIB 200 MG PO CAPS
400.0000 mg | ORAL_CAPSULE | ORAL | Status: AC
  Administered 2017-02-22: 400 mg via ORAL
  Filled 2017-02-22: qty 2

## 2017-02-22 MED ORDER — BUPIVACAINE HCL (PF) 0.25 % IJ SOLN
INTRAMUSCULAR | Status: AC
  Filled 2017-02-22: qty 30

## 2017-02-22 MED ORDER — SUGAMMADEX SODIUM 500 MG/5ML IV SOLN
INTRAVENOUS | Status: AC
  Filled 2017-02-22: qty 5

## 2017-02-22 MED ORDER — SUCCINYLCHOLINE CHLORIDE 200 MG/10ML IV SOSY
PREFILLED_SYRINGE | INTRAVENOUS | Status: AC
  Filled 2017-02-22: qty 10

## 2017-02-22 MED ORDER — PHENYLEPHRINE HCL 10 MG/ML IJ SOLN
INTRAVENOUS | Status: DC | PRN
  Administered 2017-02-22: 30 ug/min via INTRAVENOUS

## 2017-02-22 MED ORDER — ACETAMINOPHEN 500 MG PO TABS
1000.0000 mg | ORAL_TABLET | ORAL | Status: AC
Start: 2017-02-22 — End: 2017-02-22
  Administered 2017-02-22: 1000 mg via ORAL
  Filled 2017-02-22: qty 2

## 2017-02-22 MED ORDER — BUPIVACAINE HCL 0.25 % IJ SOLN
INTRAMUSCULAR | Status: DC | PRN
  Administered 2017-02-22: 10 mL

## 2017-02-22 MED ORDER — PROPOFOL 10 MG/ML IV BOLUS
INTRAVENOUS | Status: AC
  Filled 2017-02-22: qty 40

## 2017-02-22 MED ORDER — BUPIVACAINE-EPINEPHRINE (PF) 0.5% -1:200000 IJ SOLN
INTRAMUSCULAR | Status: DC | PRN
  Administered 2017-02-22: 30 mL via PERINEURAL

## 2017-02-22 MED ORDER — MIDAZOLAM HCL 2 MG/2ML IJ SOLN
2.0000 mg | Freq: Once | INTRAMUSCULAR | Status: AC
  Administered 2017-02-22: 2 mg via INTRAVENOUS

## 2017-02-22 MED ORDER — ONDANSETRON HCL 4 MG/2ML IJ SOLN
INTRAMUSCULAR | Status: AC
  Filled 2017-02-22: qty 2

## 2017-02-22 MED ORDER — SUCCINYLCHOLINE 20MG/ML (10ML) SYRINGE FOR MEDFUSION PUMP - OPTIME
INTRAMUSCULAR | Status: DC | PRN
  Administered 2017-02-22: 140 mg via INTRAVENOUS

## 2017-02-22 MED ORDER — LIDOCAINE 2% (20 MG/ML) 5 ML SYRINGE
INTRAMUSCULAR | Status: AC
  Filled 2017-02-22: qty 5

## 2017-02-22 MED ORDER — HYDROCODONE-ACETAMINOPHEN 5-325 MG PO TABS: 1.0000 | ORAL_TABLET | Freq: Four times a day (QID) | ORAL | 0 refills | Status: AC | PRN

## 2017-02-22 MED ORDER — ROCURONIUM BROMIDE 50 MG/5ML IV SOSY
PREFILLED_SYRINGE | INTRAVENOUS | Status: AC
  Filled 2017-02-22: qty 5

## 2017-02-22 MED ORDER — FENTANYL CITRATE (PF) 100 MCG/2ML IJ SOLN
INTRAMUSCULAR | Status: AC
  Filled 2017-02-22: qty 2

## 2017-02-22 MED ORDER — ALBUTEROL SULFATE HFA 108 (90 BASE) MCG/ACT IN AERS
INHALATION_SPRAY | RESPIRATORY_TRACT | Status: DC | PRN
  Administered 2017-02-22: 4 via RESPIRATORY_TRACT

## 2017-02-22 MED ORDER — LACTATED RINGERS IV SOLN
INTRAVENOUS | Status: DC | PRN
  Administered 2017-02-22 (×2): via INTRAVENOUS

## 2017-02-22 MED ORDER — PROPOFOL 10 MG/ML IV BOLUS
INTRAVENOUS | Status: DC | PRN
  Administered 2017-02-22: 200 mg via INTRAVENOUS
  Administered 2017-02-22: 90 mg via INTRAVENOUS

## 2017-02-22 MED ORDER — GABAPENTIN 300 MG PO CAPS
300.0000 mg | ORAL_CAPSULE | ORAL | Status: AC
  Administered 2017-02-22: 300 mg via ORAL
  Filled 2017-02-22: qty 1

## 2017-02-22 MED ORDER — ALBUTEROL SULFATE HFA 108 (90 BASE) MCG/ACT IN AERS
INHALATION_SPRAY | RESPIRATORY_TRACT | Status: AC
  Filled 2017-02-22: qty 6.7

## 2017-02-22 MED ORDER — CEFAZOLIN SODIUM-DEXTROSE 2-4 GM/100ML-% IV SOLN
2.0000 g | INTRAVENOUS | Status: AC
  Administered 2017-02-22: 2 g via INTRAVENOUS
  Filled 2017-02-22: qty 100

## 2017-02-22 MED ORDER — ROCURONIUM BROMIDE 10 MG/ML (PF) SYRINGE
PREFILLED_SYRINGE | INTRAVENOUS | Status: DC | PRN
  Administered 2017-02-22: 40 mg via INTRAVENOUS

## 2017-02-22 MED ORDER — DIPHENHYDRAMINE HCL 50 MG/ML IJ SOLN
INTRAMUSCULAR | Status: DC | PRN
  Administered 2017-02-22: 25 mg via INTRAVENOUS

## 2017-02-22 SURGICAL SUPPLY — 58 items
APL SKNCLS STERI-STRIP NONHPOA (GAUZE/BANDAGES/DRESSINGS) ×1
APPLIER CLIP 9.375 MED OPEN (MISCELLANEOUS) ×3
APR CLP MED 9.3 20 MLT OPN (MISCELLANEOUS) ×1
BENZOIN TINCTURE PRP APPL 2/3 (GAUZE/BANDAGES/DRESSINGS) ×3 IMPLANT
BINDER BREAST LRG (GAUZE/BANDAGES/DRESSINGS) ×2 IMPLANT
BINDER BREAST XLRG (GAUZE/BANDAGES/DRESSINGS) IMPLANT
BLADE SURG 15 STRL LF DISP TIS (BLADE) ×1 IMPLANT
BLADE SURG 15 STRL SS (BLADE) ×3
CANISTER SUCT 3000ML PPV (MISCELLANEOUS) ×3 IMPLANT
CHLORAPREP W/TINT 26ML (MISCELLANEOUS) ×3 IMPLANT
CLIP APPLIE 9.375 MED OPEN (MISCELLANEOUS) IMPLANT
CLOSURE WOUND 1/2 X4 (GAUZE/BANDAGES/DRESSINGS) ×1
CONT SPEC 4OZ CLIKSEAL STRL BL (MISCELLANEOUS) ×2 IMPLANT
COVER PROBE W GEL 5X96 (DRAPES) ×3 IMPLANT
COVER SURGICAL LIGHT HANDLE (MISCELLANEOUS) ×3 IMPLANT
DEVICE DUBIN SPECIMEN MAMMOGRA (MISCELLANEOUS) ×3 IMPLANT
DRAPE CHEST BREAST 15X10 FENES (DRAPES) ×3 IMPLANT
DRAPE UTILITY XL STRL (DRAPES) ×3 IMPLANT
DRSG TEGADERM 4X4.75 (GAUZE/BANDAGES/DRESSINGS) ×3 IMPLANT
ELECT BLADE 6.5 EXT (BLADE) ×2 IMPLANT
ELECT CAUTERY BLADE 6.4 (BLADE) ×3 IMPLANT
ELECT REM PT RETURN 9FT ADLT (ELECTROSURGICAL) ×3
ELECTRODE REM PT RTRN 9FT ADLT (ELECTROSURGICAL) ×1 IMPLANT
GAUZE SPONGE 2X2 8PLY STRL LF (GAUZE/BANDAGES/DRESSINGS) ×1 IMPLANT
GLOVE BIO SURGEON STRL SZ7 (GLOVE) ×3 IMPLANT
GLOVE BIO SURGEON STRL SZ8 (GLOVE) ×2 IMPLANT
GLOVE BIOGEL PI IND STRL 6 (GLOVE) IMPLANT
GLOVE BIOGEL PI IND STRL 7.5 (GLOVE) ×1 IMPLANT
GLOVE BIOGEL PI IND STRL 8.5 (GLOVE) IMPLANT
GLOVE BIOGEL PI INDICATOR 6 (GLOVE) ×2
GLOVE BIOGEL PI INDICATOR 7.5 (GLOVE) ×2
GLOVE BIOGEL PI INDICATOR 8.5 (GLOVE) ×2
GOWN STRL REUS W/ TWL LRG LVL3 (GOWN DISPOSABLE) ×2 IMPLANT
GOWN STRL REUS W/ TWL XL LVL3 (GOWN DISPOSABLE) IMPLANT
GOWN STRL REUS W/TWL LRG LVL3 (GOWN DISPOSABLE) ×3
GOWN STRL REUS W/TWL XL LVL3 (GOWN DISPOSABLE) ×3
KIT BASIN OR (CUSTOM PROCEDURE TRAY) ×3 IMPLANT
KIT MARKER MARGIN INK (KITS) ×3 IMPLANT
LIGHT WAVEGUIDE WIDE FLAT (MISCELLANEOUS) ×2 IMPLANT
NDL HYPO 25X1 1.5 SAFETY (NEEDLE) ×1 IMPLANT
NEEDLE HYPO 25X1 1.5 SAFETY (NEEDLE) ×3 IMPLANT
NS IRRIG 1000ML POUR BTL (IV SOLUTION) ×3 IMPLANT
PACK SURGICAL SETUP 50X90 (CUSTOM PROCEDURE TRAY) ×3 IMPLANT
PENCIL BUTTON HOLSTER BLD 10FT (ELECTRODE) ×3 IMPLANT
SPONGE GAUZE 2X2 STER 10/PKG (GAUZE/BANDAGES/DRESSINGS) ×2
SPONGE LAP 4X18 X RAY DECT (DISPOSABLE) ×3 IMPLANT
STRIP CLOSURE SKIN 1/2X4 (GAUZE/BANDAGES/DRESSINGS) ×2 IMPLANT
SUT MNCRL AB 4-0 PS2 18 (SUTURE) ×3 IMPLANT
SUT SILK 2 0 SH (SUTURE) IMPLANT
SUT VIC AB 3-0 SH 27 (SUTURE) ×3
SUT VIC AB 3-0 SH 27X BRD (SUTURE) ×1 IMPLANT
SYR BULB 3OZ (MISCELLANEOUS) ×3 IMPLANT
SYR CONTROL 10ML LL (SYRINGE) ×3 IMPLANT
TOWEL OR 17X24 6PK STRL BLUE (TOWEL DISPOSABLE) ×3 IMPLANT
TOWEL OR 17X26 10 PK STRL BLUE (TOWEL DISPOSABLE) ×1 IMPLANT
TUBE CONNECTING 12'X1/4 (SUCTIONS) ×1
TUBE CONNECTING 12X1/4 (SUCTIONS) ×2 IMPLANT
YANKAUER SUCT BULB TIP NO VENT (SUCTIONS) ×3 IMPLANT

## 2017-02-22 NOTE — Transfer of Care (Signed)
Immediate Anesthesia Transfer of Care Note  Patient: Tricia Nelson  Procedure(s) Performed: Procedure(s): RADIOACTIVE SEED GUIDED LEFT BREAST LUMPECTOMY (Left)  Patient Location: PACU  Anesthesia Type:GA combined with regional for post-op pain  Level of Consciousness: awake and alert   Airway & Oxygen Therapy: Patient Spontanous Breathing and Patient connected to nasal cannula oxygen  Post-op Assessment: Report given to RN and Post -op Vital signs reviewed and stable  Post vital signs: Reviewed and stable  Last Vitals:  Vitals:   02/22/17 0920 02/22/17 0921  BP:  (!) 176/74  Pulse: 93 92  Resp: 18 14  Temp:      Last Pain:  Vitals:   02/22/17 0742  TempSrc: Oral      Patients Stated Pain Goal: 3 (02/22/17 0734)  Complications: No apparent anesthesia complications

## 2017-02-22 NOTE — Interval H&P Note (Signed)
History and Physical Interval Note:  02/22/2017 7:22 AM  Tricia Nelson  has presented today for surgery, with the diagnosis of LEFT BREAST MASS  The various methods of treatment have been discussed with the patient and family. After consideration of risks, benefits and other options for treatment, the patient has consented to  Procedure(s): LEFT BREAST LUMPECTOMY WITH RADIOACTIVE SEED LOCALIZATION (Left) as a surgical intervention .  The patient's history has been reviewed, patient examined, no change in status, stable for surgery.  I have reviewed the patient's chart and labs.  Questions were answered to the patient's satisfaction.     Mekenzie Modeste K.

## 2017-02-22 NOTE — H&P (View-Only) (Signed)
History of Present Illness Tricia Nelson. Davanee Klinkner MD; 02/11/2017 10:56 AM) The patient is a 56 year old female who presents with a breast mass. Referred by Tricia Castle, NP for left breast mass  This is a 57 year old female with a family history of breast cancer in her mother who passed away at age 67 who presents with a palpable mass in her upper inner quadrant of her left breast. January 2017, the patient underwent routine screening mammogram. This showed a suspicious area in the upper inner quadrant of her left breast. She underwent further workup with mammogram and ultrasound as well as biopsy showing fibrocystic changes and chronic inflammation. However, follow-up mammogram on 02/03/17 showed that this mass is larger and has become more spiculated. A biopsy clip is in place. She presents now to discuss excision.   CLINICAL DATA: Screening.  EXAM: DIGITAL SCREENING BILATERAL MAMMOGRAM WITH CAD  COMPARISON: Previous exam(s).  ACR Breast Density Category c: The breast tissue is heterogeneously dense, which may obscure small masses.  FINDINGS: In the left breast, a possible mass warrants further evaluation. In the right breast, no findings suspicious for malignancy.  Images were processed with CAD.  IMPRESSION: Further evaluation is suggested for possible mass in the left breast.  RECOMMENDATION: Diagnostic mammogram and possibly ultrasound of the left breast. (Code:[id number])  The patient will be contacted regarding the findings, and additional imaging will be scheduled.  BI-RADS CATEGORY 0: Incomplete. Need additional imaging evaluation and/or prior mammograms for comparison.   Electronically Signed By: Abelardo Diesel M.D. On: 12/31/2015 13:45  CLINICAL DATA: Recall from screening mammography. The patient does have a family history of postmenopausal breast cancer in her mother (now deceased from breast cancer).  EXAM: DIGITAL DIAGNOSTIC LEFT MAMMOGRAM WITH 3D  TOMOSYNTHESIS WITH CAD  ULTRASOUND LEFT BREAST  COMPARISON: Previous exam(s).  ACR Breast Density Category c: The breast tissue is heterogeneously dense, which may obscure small masses.  FINDINGS: There is a subtle area of developing density located within the upper inner quadrant of the left breast with possible subtle distortion. The mass is located within the posterior 1/3 of the left breast at the 11 o'clock position approximately 7 cm from the nipple.  Mammographic images were processed with CAD.  On physical exam, there is firm thickening located within the upper inner quadrant of the left breast. There is no palpable left axillary adenopathy.  Targeted ultrasound is performed, showing a subtle, irregular hypoechoic mass with shadowing and possible distortion located within the left breast at the 11 o'clock position 7 cm from the nipple measuring 2.3 x 1.9 x 1.5 cm in size. Tissue sampling is recommended. I have discussed ultrasound-guided core biopsy of this area. This will be scheduled per patient preference.  Ultrasound of the left axilla demonstrates normal axillary contents and no evidence for adenopathy. Also, ultrasound of the left internal mammary chain region demonstrates no adenopathy.  IMPRESSION: 2.3 cm irregular mass corresponding to an area of subtle developing density with possible distortion located within the left breast at the 11 o'clock position 7 cm from the nipple. Tissue sampling is recommended and ultrasound-guided core biopsy will be scheduled.  RECOMMENDATION: Left breast ultrasound-guided core biopsy.  I have discussed the findings and recommendations with the patient. Results were also provided in writing at the conclusion of the visit. If applicable, a reminder letter will be sent to the patient regarding the next appointment.  BI-RADS CATEGORY 4: Suspicious.   Electronically Signed By: Altamese Cabal M.D. On: 01/07/2016  10:01  CLINICAL DATA: Palpable mass 11 o'clock position left breast 7 cm from the nipple. Suspicious features on ultrasound.  EXAM: ULTRASOUND GUIDED LEFT BREAST CORE NEEDLE BIOPSY  COMPARISON: 01/07/2016  PROCEDURE: I met with the patient and we discussed the procedure of ultrasound-guided biopsy, including benefits and alternatives. We discussed the high likelihood of a successful procedure. We discussed the risks of the procedure including infection, bleeding, tissue injury, clip migration, and inadequate sampling. Informed written consent was given. The usual time-out protocol was performed immediately prior to the procedure.  Using sterile technique and 1% Lidocaine as local anesthetic, under direct ultrasound visualization, a 12 gauge vacuum-assisted device was used to perform biopsy of an irregular hypoechoic mass at 11 o'clock position left breast using a medial to lateral approach. At the conclusion of the procedure, a ribbon shaped tissue marker clip was deployed into the biopsy cavity. Follow-up 2-view mammogram was performed and dictated separately.  IMPRESSION: Ultrasound-guided biopsy of left breast mass. No apparent complications.  Electronically Signed: By: Curlene Dolphin M.D. On: 01/08/2016 16:30  ADDENDUM REPORT: 01/09/2016 11:13 ADDENDUM: Pathology revealed fibrocystic changes with chronic inflammation and fibrosis in the left breast. This was found to be concordant by Dr. Curlene Dolphin. Pathology was discussed with the patient by telephone. She reported doing well after the biopsy. Post biopsy instructions and care were reviewed and her questions were answered. She was encouraged to call [facility] for any additional concerns. She was asked to return for left diagnostic mammography and possible ultrasound in 6 months. Pathology results reported by Susa Raring RN, BSN on January 09, 2016. Electronically Signed By: Curlene Dolphin M.D. On: 01/09/2016 11:13  CLINICAL DATA: Ultrasound-guided biopsy was performed of the left breast.  EXAM: DIAGNOSTIC LEFT MAMMOGRAM POST ULTRASOUND BIOPSY  COMPARISON: Previous exam(s).  FINDINGS: Mammographic images were obtained following ultrasound guided biopsy of a left breast mass in the upper inner. A ribbon shaped biopsy clip is satisfactorily positioned within the developing density of the upper inner left breast.  IMPRESSION: Satisfactory position of ribbon shaped biopsy clip in the left breast.  Final Assessment: Post Procedure Mammograms for Marker Placement   Electronically Signed By: Curlene Dolphin M.D. On: 01/08/2016 16:31  CLINICAL DATA: Followup after benign ultrasound guided biopsy of a left breast mass with pathology revealing inflammation and fibrosis. The patient states there is a persistent palpable mass in the upper inner left breast which she feels has increased in size over time.  EXAM: 2D DIGITAL DIAGNOSTIC BILATERAL MAMMOGRAM WITH CAD AND ADJUNCT TOMO  COMPARISON: Previous exam(s).  ACR Breast Density Category c: The breast tissue is heterogeneously dense, which may obscure small masses.  FINDINGS: No suspicious masses or calcifications are seen in the right breast. A ribbon shaped biopsy marking clip is present at site of prior benign left breast biopsy. The mass in the upper inner left breast appears irregular and now spiculated in appearance measuring approximately 1.8 cm.  Mammographic images were processed with CAD.  Physical examination of the upper inner left breast reveals a firm palpable mass at the approximate 11 o'clock position.  IMPRESSION: Left breast mass at 11 o'clock previously biopsied with pathology demonstrating fibrosis and inflammation. Given the irregular spiculated appearance of this mass, surgical excision is recommended.  RECOMMENDATION: Recommend surgical excision of the mass in the upper  inner posterior left breast at site of ribbon shaped biopsy marking clip. Surgical consultation will be arranged for the patient.  I have discussed the findings and recommendations  with the patient. Results were also provided in writing at the conclusion of the visit. If applicable, a reminder letter will be sent to the patient regarding the next appointment.  BI-RADS CATEGORY 0: Incomplete. Need additional imaging evaluation and/or prior mammograms for comparison.   Electronically Signed By: Everlean Alstrom M.D. On: 02/03/2017 08:37    Past Surgical History Patsey Berthold, [place]; 02/11/2017 10:03 AM) Breast Biopsy Left. Cataract Surgery Bilateral.  Diagnostic Studies History Patsey Berthold, [place]; 02/11/2017 10:03 AM) Colonoscopy 1-5 years ago Mammogram within last year Pap Smear >5 years ago  Allergies Patsey Berthold, [place]; 02/11/2017 10:04 AM) No Known Drug Allergies 02/11/2017  Medication History Patsey Berthold, CMA; 02/11/2017 10:04 AM) Carlyn Reichert (50-1000MG Tablet, Oral) Active. Jardiance (25MG Tablet, Oral) Active. Simvastatin (20MG Tablet, Oral) Active. Lisinopril (10MG Tablet, Oral) Active. Medications Reconciled  Social History Patsey Berthold, CMA; 02/11/2017 10:04 AM) Alcohol use Occasional alcohol use. Caffeine use Coffee. No drug use Tobacco use Never smoker.  Family History Patsey Berthold, [place]; 02/11/2017 10:04 AM) Alcohol Abuse Family Members In General, Father. Breast Cancer Mother. Colon Cancer Family Members In General. Depression Sister. Diabetes Mellitus Father, Sister. Heart Disease Family Members In General. Hypertension Family Members In General, Mother.  Pregnancy / Birth History Patsey Berthold, [place]; 02/11/2017 10:04 AM) Age at menarche 71 years. Contraceptive History Oral contraceptives. Gravida 3 Irregular periods Maternal age 83-20  Other Problems Patsey Berthold, [place]; 02/11/2017 10:04 AM) Diabetes  [place] Gastroesophageal Reflux Disease High blood pressure     Review of Systems Patsey Berthold CMA; 02/11/2017 10:04 AM) General Not Present- Appetite Loss, Chills, Fatigue, Fever, Night Sweats, Weight Gain and Weight Loss. Skin Not Present- Change in Wart/Mole, Dryness, Hives, Jaundice, New Lesions, Non-Healing Wounds, Rash and Ulcer. HEENT Present- Wears glasses/contact lenses. Not Present- Earache, Hearing Loss, Hoarseness, Nose Bleed, Oral Ulcers, Ringing in the Ears, Seasonal Allergies, Sinus Pain, Sore Throat, Visual Disturbances and Yellow Eyes. Respiratory Not Present- Bloody sputum, Chronic Cough, Difficulty Breathing, Snoring and Wheezing. Breast Present- Breast Mass. Not Present- Breast Pain, Nipple Discharge and Skin Changes. Cardiovascular Not Present- Chest Pain, Difficulty Breathing Lying Down, Leg Cramps, Palpitations, Rapid Heart Rate, Shortness of Breath and Swelling of Extremities. Gastrointestinal Present- Difficulty Swallowing and Gets full quickly at meals. Not Present- Abdominal Pain, Bloating, Bloody Stool, Change in Bowel Habits, Chronic diarrhea, Constipation, Excessive gas, Hemorrhoids, Indigestion, Nausea, Rectal Pain and Vomiting. Female Genitourinary Not Present- Frequency, Nocturia, Painful Urination, Pelvic Pain and Urgency. Musculoskeletal Present- Back Pain. Not Present- Joint Pain, Joint Stiffness, Muscle Pain, Muscle Weakness and Swelling of Extremities. Psychiatric Not Present- Anxiety, Bipolar, Change in Sleep Pattern, Depression, Fearful and Frequent crying. Endocrine Not Present- Cold Intolerance, Excessive Hunger, Hair Changes, Heat Intolerance, Hot flashes and New Diabetes. Hematology Not Present- Blood Thinners, Easy Bruising, Excessive bleeding, Gland problems, HIV and Persistent Infections.  Vitals Patsey Berthold CMA; 02/11/2017 10:05 AM) 02/11/2017 10:04 AM Weight: 187.4 lb Height: 64in Height was reported by patient. Body Surface Area:  1.9 m Body Mass Index: 32.17 kg/m  Temp.: 98.62F  Pulse: 84 (Regular)  BP: 134/78 (Sitting, Left Arm, Standard)      Physical Exam Rodman Key K. Kieli Golladay MD; 02/11/2017 10:58 AM)  The physical exam findings are as follows: Note:WDWN in NAD Eyes: Pupils equal, round; sclera anicteric HENT: Oral mucosa moist; good dentition Neck: No masses palpated, no thyromegaly Lungs: CTA bilaterally; normal respiratory effort Breasts: symmetric; bilateral retracted nipples - chronic No axillary lymphadenopathy; no right breast masses Left upper inner quadrant - indistinct firmness about 5-7 cm  from edge of nipple at 10-11:00 CV: Regular rate and rhythm; no murmurs; extremities well-perfused with no edema Abd: +bowel sounds, soft, non-tender, no palpable organomegaly; no palpable hernias Skin: Warm, dry; no sign of jaundice Psychiatric - alert and oriented x 4; calm mood and affect    Assessment & Plan Rodman Key K. Hema Lanza MD; 02/11/2017 10:59 AM)  MASS OF UPPER INNER QUADRANT OF LEFT BREAST (N63.22)  Current Plans Pt Education - [facility] Breast Cancer Information Given - [facility] "Breast Journey" Package Schedule for Surgery - Left radioactive seed localized lumpectomy. The surgical procedure has been discussed with the patient. Potential risks, benefits, alternative treatments, and expected outcomes have been explained. All of the patient's questions at this time have been answered. The likelihood of reaching the patient's treatment goal is good. The patient understand the proposed surgical procedure and wishes to proceed. Note:She did not undergo a repeat core biopsy as the previous biopsy was negative. A negative biopsy at this time would not change our surgical plan other than the possibility of sentinel lymph node biopsy.  I spent approximately 30 minutes of additional time with the patient discussing her surgical options. We discussed the usual pathway of a preoperative core biopsy to  help plan lumpectomy with or without sentinel lymph node biopsy. However, with a previous biopsy that was negative for malignancy, a negative repeat biopsy would not change our plan to excise this area. She understands and wishes to proceed with radioactive seed localization lumpectomy.  Tricia Nelson. Georgette Dover, MD, [facility]  General/ Trauma Surgery  02/11/2017 11:00 AM

## 2017-02-22 NOTE — Op Note (Signed)
Preop diagnosis: Suspicious left breast mass Postop diagnosis: Same Procedure performed: Left radioactive seed localized lumpectomy Surgeon:Maximilliano Kersh K.  Anesthesia: Gen. endotracheal Indications: This is a 56 year old female with a family history of breast cancer who presents after a core biopsy for suspicious mass performed in 2017. This was in the upper inner quadrant of her left breast. Biopsy last year showed fibrocystic changes and chronic inflammation. Follow-up mammogram this year showed that the mass had enlarged and has become more spiculated. The biopsy clip from last year remains in place. She presents now for excisional biopsy for diagnosis.  Description of procedure: The patient brought to the operating room placed in a supine position on the operating room table. The presence of the seed had previously confirmed in the left upper inner quadrant using the neoprobe in the preoperative area. After an adequate level of general anesthesia was obtained, her left breast was prepped with ChloraPrep and draped in sterile fashion. A timeout was taken to ensure the proper patient and proper procedure. We localized the area of greatest activity in the left upper quadrant about 7 cm from the edge of the nipple. We anesthetized the area around the nipple in the upper inner quadrant of the breast. I made a circumareolar incision. We dissected into the breast tissue and dissected superiorly and medially to the area of activity. We used the lighted retractors. Once we had dissected up to the area of the seed, we raised 4 margins around this area and dissected down around the site of greatest activity.  The patient has a very dense breast and dissection was fairly slow with cautery. We grasped the mass with a clamp and lifted. We dissected around the mass entirely. As we were removing it to see became dislodged. This was easily visualized and were removed entirely intact. This was sent as a separate  specimen. We completed excising the entire mass. The specimen was oriented with a a kit. Of note the anterior posterior margins were accidentally transposed. This is noted on the pathology report. The specimen mammogram showed that the biopsy clip was in the posterior portion of the specimen. The edges of the specimen were oriented with 5 clips. We irrigated the wound thoroughly and inspected for hemostasis. The wound was closed with a deep layer of 3-0 Vicryl and a subcuticular layer of 4-0 Monocryl. Benzoin Steri-Strips were applied. A clean dressing was applied. The patient was then extubated and brought to recovery room in stable condition. All sponge, instrument, and needle counts are correct.  [name]. Georgette Dover, MD, [facility]  General/ Trauma Surgery  02/22/2017 11:14 AM

## 2017-02-22 NOTE — Anesthesia Preprocedure Evaluation (Signed)
Anesthesia Evaluation  Patient identified by MRN, date of birth, ID band Patient awake    Reviewed: Allergy & Precautions, H&P , Patient's Chart, lab work & pertinent test results, reviewed documented beta blocker date and time   Airway Mallampati: II  TM Distance: >3 FB Neck ROM: full    Dental no notable dental hx.    Pulmonary    Pulmonary exam normal breath sounds clear to auscultation       Cardiovascular hypertension,  Rhythm:regular Rate:Normal     Neuro/Psych    GI/Hepatic   Endo/Other  diabetes  Renal/GU      Musculoskeletal   Abdominal   Peds  Hematology   Anesthesia Other Findings   Reproductive/Obstetrics                             Anesthesia Physical Anesthesia Plan  ASA: II  Anesthesia Plan: General   Post-op Pain Management:  Regional for Post-op pain   Induction: Intravenous  Airway Management Planned: LMA  Additional Equipment:   Intra-op Plan:   Post-operative Plan:   Informed Consent: I have reviewed the patients History and Physical, chart, labs and discussed the procedure including the risks, benefits and alternatives for the proposed anesthesia with the patient or authorized representative who has indicated his/her understanding and acceptance.   Dental Advisory Given and Dental advisory given  Plan Discussed with: CRNA and Surgeon  Anesthesia Plan Comments: (Discussed GA with LMA, possible sore throat, potential need to switch to ETT, N/V, pulmonary aspiration. Questions answered. )        Anesthesia Quick Evaluation

## 2017-02-22 NOTE — Anesthesia Postprocedure Evaluation (Signed)
Anesthesia Post Note  Patient: Tricia Nelson  Procedure(s) Performed: Procedure(s) (LRB): RADIOACTIVE SEED GUIDED LEFT BREAST LUMPECTOMY (Left)  Patient location during evaluation: PACU Anesthesia Type: General Level of consciousness: sedated Pain management: satisfactory to patient Vital Signs Assessment: post-procedure vital signs reviewed and stable Respiratory status: spontaneous breathing Cardiovascular status: stable Anesthetic complications: no       Last Vitals:  Vitals:   02/22/17 1211 02/22/17 1223  BP:  (!) 149/75  Pulse: 86   Resp: 15   Temp: 37 C 36.7 C    Last Pain:  Vitals:   02/22/17 1223  TempSrc: Oral  PainSc:                  Jiles GarterJACKSON,Lachelle Rissler EDWARD

## 2017-02-22 NOTE — Discharge Instructions (Signed)
[facility] °Office Phone Number [phone] ° °BREAST BIOPSY/ PARTIAL MASTECTOMY: POST OP INSTRUCTIONS ° °Always review your discharge instruction sheet given to you by the facility where your surgery was performed. ° °IF YOU HAVE DISABILITY OR FAMILY LEAVE FORMS, YOU MUST BRING THEM TO THE OFFICE FOR PROCESSING.  DO NOT GIVE THEM TO YOUR DOCTOR. ° °1. A prescription for pain medication may be given to you upon discharge.  Take your pain medication as prescribed, if needed.  If narcotic pain medicine is not needed, then you may take acetaminophen (Tylenol) or ibuprofen (Advil) as needed. °2. Take your usually prescribed medications unless otherwise directed °3. If you need a refill on your pain medication, please contact your pharmacy.  They will contact our office to request authorization.  Prescriptions will not be filled after 5pm or on week-ends. °4. You should eat very light the first 24 hours after surgery, such as soup, crackers, pudding, etc.  Resume your normal diet the day after surgery. °5. Most patients will experience some swelling and bruising in the breast.  Ice packs and a good support bra will help.  Swelling and bruising can take several days to resolve.  °6. It is common to experience some constipation if taking pain medication after surgery.  Increasing fluid intake and taking a stool softener will usually help or prevent this problem from occurring.  A mild laxative (Milk of Magnesia or Miralax) should be taken according to package directions if there are no bowel movements after 48 hours. °7. Unless discharge instructions indicate otherwise, you may remove your bandages 24-48 hours after surgery, and you may shower at that time.  You may have steri-strips (small skin tapes) in place directly over the incision.  These strips should be left on the skin for 7-10 days.  If your surgeon used skin glue on the incision, you may shower in 24 hours.  The glue will flake off over the  next 2-3 weeks.  Any sutures or staples will be removed at the office during your follow-up visit. °8. ACTIVITIES:  You may resume regular daily activities (gradually increasing) beginning the next day.  Wearing a good support bra or sports bra minimizes pain and swelling.  You may have sexual intercourse when it is comfortable. °a. You may drive when you no longer are taking prescription pain medication, you can comfortably wear a seatbelt, and you can safely maneuver your car and apply brakes. °b. RETURN TO WORK:  ______________________________________________________________________________________ °9. You should see your doctor in the office for a follow-up appointment approximately two weeks after your surgery.  Your doctor’s nurse will typically make your follow-up appointment when she calls you with your pathology report.  Expect your pathology report 2-3 business days after your surgery.  You may call to check if you do not hear from us after three days. °10. OTHER INSTRUCTIONS: _______________________________________________________________________________________________ _____________________________________________________________________________________________________________________________________ °_____________________________________________________________________________________________________________________________________ °_____________________________________________________________________________________________________________________________________ ° °WHEN TO CALL YOUR DOCTOR: °1. Fever over 101.0 °2. Nausea and/or vomiting. °3. Extreme swelling or bruising. °4. Continued bleeding from incision. °5. Increased pain, redness, or drainage from the incision. ° °The clinic staff is available to answer your questions during regular business hours.  Please don’t hesitate to call and ask to speak to one of the nurses for clinical concerns.  If you have a medical emergency, go to the nearest  emergency room or call 911.  A surgeon from [facility] is always on call at the hospital. ° °For further questions, please visit [email or web address]  °

## 2017-02-22 NOTE — Anesthesia Procedure Notes (Signed)
Procedure Name: Intubation Date/Time: 02/22/2017 9:55 AM Performed by: Valda Favia Pre-anesthesia Checklist: Patient identified, Emergency Drugs available, Suction available, Patient being monitored and Timeout performed Patient Re-evaluated:Patient Re-evaluated prior to inductionOxygen Delivery Method: Circle system utilized Preoxygenation: Pre-oxygenation with 100% oxygen Intubation Type: IV induction Ventilation: Mask ventilation without difficulty Laryngoscope Size: Mac and 4 Grade View: Grade I Tube type: Oral Tube size: 7.0 mm Number of attempts: 1 Airway Equipment and Method: Stylet Placement Confirmation: ETT inserted through vocal cords under direct vision,  positive ETCO2 and breath sounds checked- equal and bilateral Secured at: 20 cm Tube secured with: Tape Dental Injury: Teeth and Oropharynx as per pre-operative assessment

## 2017-02-22 NOTE — Anesthesia Procedure Notes (Signed)
Procedure Name: LMA Insertion Date/Time: 02/22/2017 9:53 AM Performed by: Samara DeistBECKNER, Hoyle Barkdull B Pre-anesthesia Checklist: Patient identified, Emergency Drugs available, Suction available, Patient being monitored and Timeout performed Patient Re-evaluated:Patient Re-evaluated prior to inductionOxygen Delivery Method: Circle system utilized Preoxygenation: Pre-oxygenation with 100% oxygen Intubation Type: IV induction LMA: LMA inserted LMA Size: 4.0 Number of attempts: 1 Placement Confirmation: positive ETCO2 and breath sounds checked- equal and bilateral Dental Injury: Teeth and Oropharynx as per pre-operative assessment  Comments: Patient coughing continuously with LMA placement with maximum sevoflurane, decision made to remove LMA resume mask ventilation and proceed with oral intubation. See intubation note

## 2017-02-22 NOTE — Anesthesia Procedure Notes (Signed)
Anesthesia Regional Block: Pectoralis block   Pre-Anesthetic Checklist: ,, timeout performed, Correct Patient, Correct Site, Correct Laterality, Correct Procedure, Correct Position, site marked, Risks and benefits discussed, pre-op evaluation,  At surgeon's request and post-op pain management  Laterality: Left  Prep: chloraprep       Needles:   Needle Type: Echogenic Needle     Needle Length: 9cm  Needle Gauge: 21     Additional Needles:   Procedures: ultrasound guided,,,,,,,,  Narrative:  Start time: 02/22/2017 9:01 AM End time: 02/22/2017 9:09 AM Injection made incrementally with aspirations every 5 mL. Anesthesiologist: Cristela BlueJACKSON, Yazeed Pryer  Additional Notes: 30cc .5% Marcaine

## 2017-02-23 ENCOUNTER — Encounter (HOSPITAL_COMMUNITY): Payer: Self-pay | Admitting: Surgery

## 2017-07-01 NOTE — Addendum Note (Signed)
Addendum  created 07/01/17 1520 by Dorse Locy, MD   Sign clinical note    

## 2017-07-01 NOTE — Anesthesia Postprocedure Evaluation (Signed)
Anesthesia Post Note  Patient: Tricia Nelson  Procedure(s) Performed: Procedure(s) (LRB): RADIOACTIVE SEED GUIDED LEFT BREAST LUMPECTOMY (Left)     Anesthesia Post Evaluation  Last Vitals:  Vitals:   02/22/17 1211 02/22/17 1223  BP:  (!) 149/75  Pulse: 86   Resp: 15   Temp: 37 C 36.7 C    Last Pain:  Vitals:   02/22/17 1223  TempSrc: Oral  PainSc:                  Jiles GarterJACKSON,Heer Justiss EDWARD

## 2017-12-30 ENCOUNTER — Other Ambulatory Visit: Payer: Self-pay | Admitting: Physician Assistant

## 2017-12-30 DIAGNOSIS — Z1231 Encounter for screening mammogram for malignant neoplasm of breast: Secondary | ICD-10-CM

## 2018-02-06 ENCOUNTER — Ambulatory Visit
Admission: RE | Admit: 2018-02-06 | Discharge: 2018-02-06 | Disposition: A | Discharge status: Home / Self Care | Payer: 59 | Source: Ambulatory Visit | Attending: Physician Assistant | Admitting: Physician Assistant

## 2018-02-06 DIAGNOSIS — Z1231 Encounter for screening mammogram for malignant neoplasm of breast: Secondary | ICD-10-CM

## 2018-08-07 IMAGING — MG NEEDLE LOCALIZATION OF THE LEFT BREAST WITH MAMMO GUIDANCE
1 series · 1 of 1 positions shown · non-contrast
Comparison: Previous exam(s).

CLINICAL DATA: The patient presents for needle localization of a
lesion in the upper inner quadrant of the left breast. Previous
ultrasound-guided core biopsy in December 2015 showed inflammation
and fibrosis. Patient reports mass has increased in size.

EXAM:
MAMMOGRAPHIC GUIDED RADIOACTIVE SEED LOCALIZATION OF THE LEFT BREAST

[L]
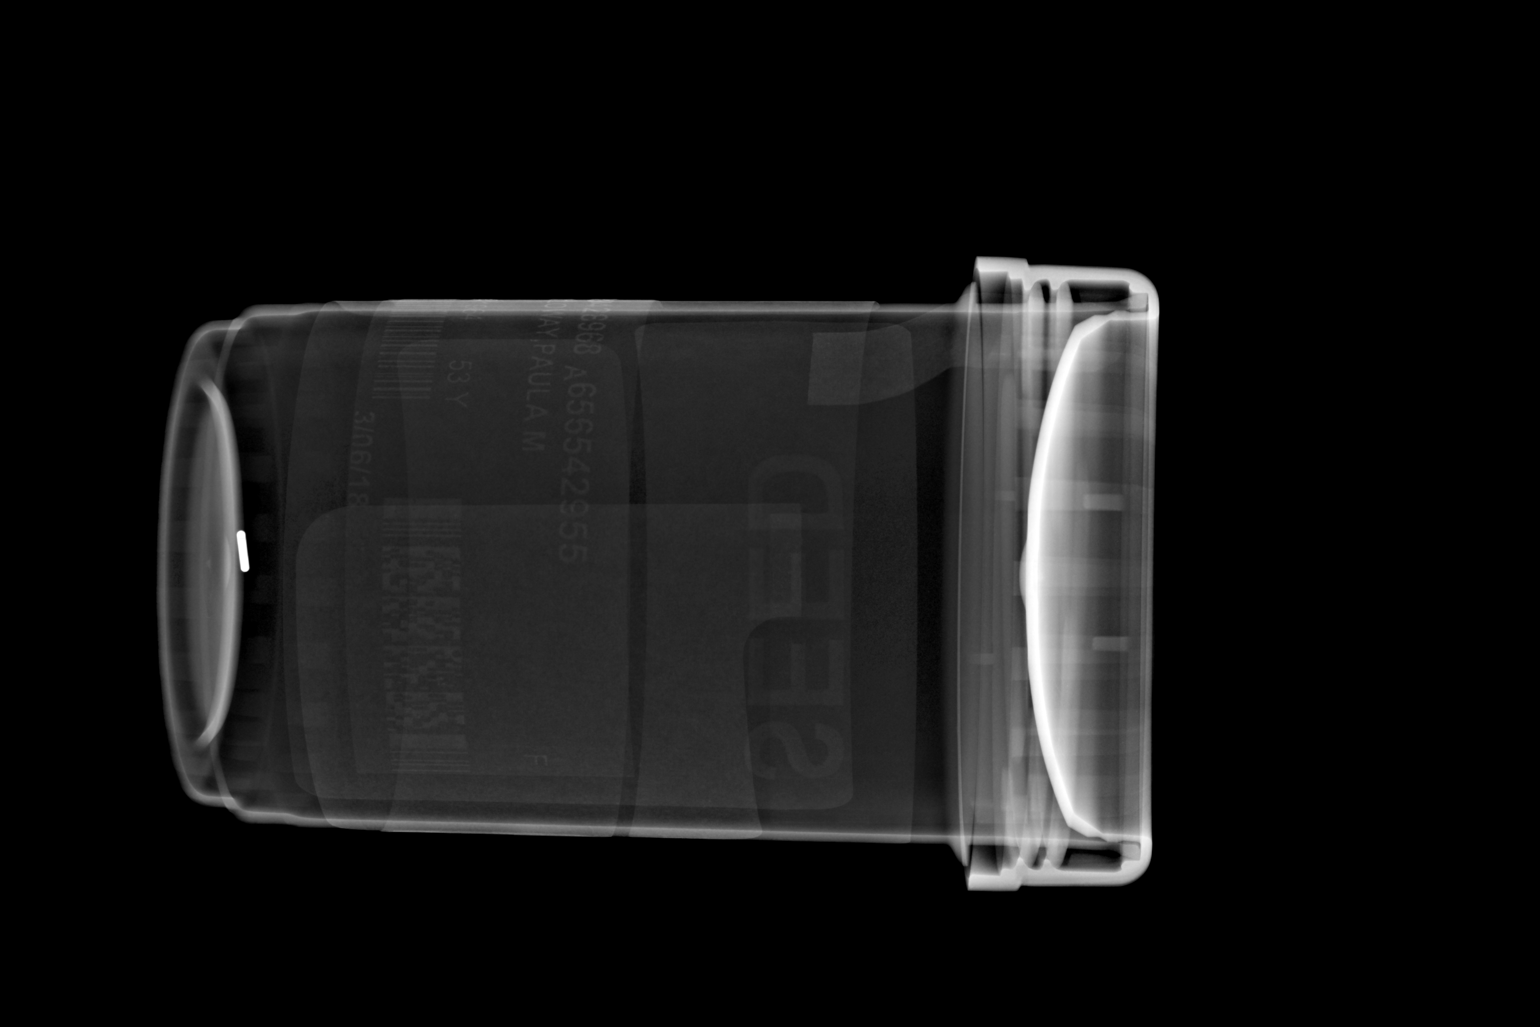

[1 of 1 positions shown; findings below may reference images not displayed]

FINDINGS: Patient presents for radioactive seed localization prior to
excision. I met with the patient and we discussed the procedure of
seed localization including benefits and alternatives. We discussed
the high likelihood of a successful procedure. We discussed the
risks of the procedure including infection, bleeding, tissue injury
and further surgery. We discussed the low dose of radioactivity
involved in the procedure. Informed, written consent was given.

The usual time-out protocol was performed immediately prior to the
procedure.

Using mammographic guidance, sterile technique, 1% lidocaine and an
[id number] radioactive seed, mass in the upper inner quadrant of the left
breast was localized using a medial approach. The follow-up
mammogram images confirm the seed in the expected location and were
marked for Dr. Isang.

Follow-up survey of the patient confirms presence of the radioactive
seed.

Order number of [id number] seed:  [id number] I.

Total activity:  A 0.249 millicuries  Reference Date: 02/01/2017

The patient tolerated the procedure well and was released from the
[REDACTED]. She was given instructions regarding seed removal.
IMPRESSION: Radioactive seed localization of the left breast. No apparent
complications.

## 2019-01-26 ENCOUNTER — Other Ambulatory Visit: Payer: Self-pay | Admitting: Physician Assistant

## 2019-01-26 DIAGNOSIS — Z1231 Encounter for screening mammogram for malignant neoplasm of breast: Secondary | ICD-10-CM

## 2019-02-23 ENCOUNTER — Ambulatory Visit
Admission: RE | Admit: 2019-02-23 | Discharge: 2019-02-23 | Disposition: A | Discharge status: Home / Self Care | Payer: Managed Care, Other (non HMO) | Source: Ambulatory Visit | Attending: Physician Assistant | Admitting: Physician Assistant

## 2019-02-23 DIAGNOSIS — Z1231 Encounter for screening mammogram for malignant neoplasm of breast: Secondary | ICD-10-CM

## 2020-03-17 ENCOUNTER — Other Ambulatory Visit: Payer: Self-pay | Admitting: Physician Assistant

## 2020-03-17 DIAGNOSIS — Z1231 Encounter for screening mammogram for malignant neoplasm of breast: Secondary | ICD-10-CM

## 2020-04-07 ENCOUNTER — Other Ambulatory Visit: Payer: Self-pay

## 2020-04-07 ENCOUNTER — Ambulatory Visit
Admission: RE | Admit: 2020-04-07 | Discharge: 2020-04-07 | Disposition: A | Discharge status: Home / Self Care | Payer: BC Managed Care – PPO | Source: Ambulatory Visit | Attending: Physician Assistant | Admitting: Physician Assistant

## 2020-04-07 DIAGNOSIS — Z1231 Encounter for screening mammogram for malignant neoplasm of breast: Secondary | ICD-10-CM

## 2020-04-07 HISTORY — DX: Other signs and symptoms in breast: N64.59

## 2021-02-27 ENCOUNTER — Other Ambulatory Visit: Payer: Self-pay | Admitting: Physician Assistant

## 2021-02-27 DIAGNOSIS — Z1231 Encounter for screening mammogram for malignant neoplasm of breast: Secondary | ICD-10-CM

## 2021-04-22 ENCOUNTER — Ambulatory Visit
Admission: RE | Admit: 2021-04-22 | Discharge: 2021-04-22 | Disposition: A | Discharge status: Home / Self Care | Payer: BC Managed Care – PPO | Source: Ambulatory Visit | Attending: Physician Assistant | Admitting: Physician Assistant

## 2021-04-22 ENCOUNTER — Other Ambulatory Visit: Payer: Self-pay

## 2021-04-22 DIAGNOSIS — Z1231 Encounter for screening mammogram for malignant neoplasm of breast: Secondary | ICD-10-CM

## 2022-02-08 ENCOUNTER — Other Ambulatory Visit: Payer: Self-pay | Admitting: Physician Assistant

## 2022-02-08 DIAGNOSIS — Z1231 Encounter for screening mammogram for malignant neoplasm of breast: Secondary | ICD-10-CM

## 2022-04-23 ENCOUNTER — Ambulatory Visit
Admission: RE | Admit: 2022-04-23 | Discharge: 2022-04-23 | Disposition: A | Discharge status: Home / Self Care | Payer: BC Managed Care – PPO | Source: Ambulatory Visit | Attending: Physician Assistant | Admitting: Physician Assistant

## 2022-04-23 DIAGNOSIS — Z1231 Encounter for screening mammogram for malignant neoplasm of breast: Secondary | ICD-10-CM

## 2023-02-15 ENCOUNTER — Other Ambulatory Visit: Payer: Self-pay

## 2023-02-15 DIAGNOSIS — Z1231 Encounter for screening mammogram for malignant neoplasm of breast: Secondary | ICD-10-CM

## 2023-04-25 ENCOUNTER — Ambulatory Visit
Admission: RE | Admit: 2023-04-25 | Discharge: 2023-04-25 | Disposition: A | Discharge status: Home / Self Care | Payer: No Typology Code available for payment source | Source: Ambulatory Visit | Attending: Physician Assistant

## 2023-04-25 DIAGNOSIS — Z1231 Encounter for screening mammogram for malignant neoplasm of breast: Secondary | ICD-10-CM

## 2023-10-06 IMAGING — MG MM DIGITAL SCREENING BILAT W/ TOMO AND CAD
8 series · 8 of 24 positions shown · non-contrast
Comparison: Previous exam(s).

CLINICAL DATA: Screening.

EXAM:
DIGITAL SCREENING BILATERAL MAMMOGRAM WITH TOMOSYNTHESIS AND CAD
TECHNIQUE: Bilateral screening digital craniocaudal and mediolateral oblique
mammograms were obtained. Bilateral screening digital breast
tomosynthesis was performed. The images were evaluated with
computer-aided detection.

[R MLO synth-2D]
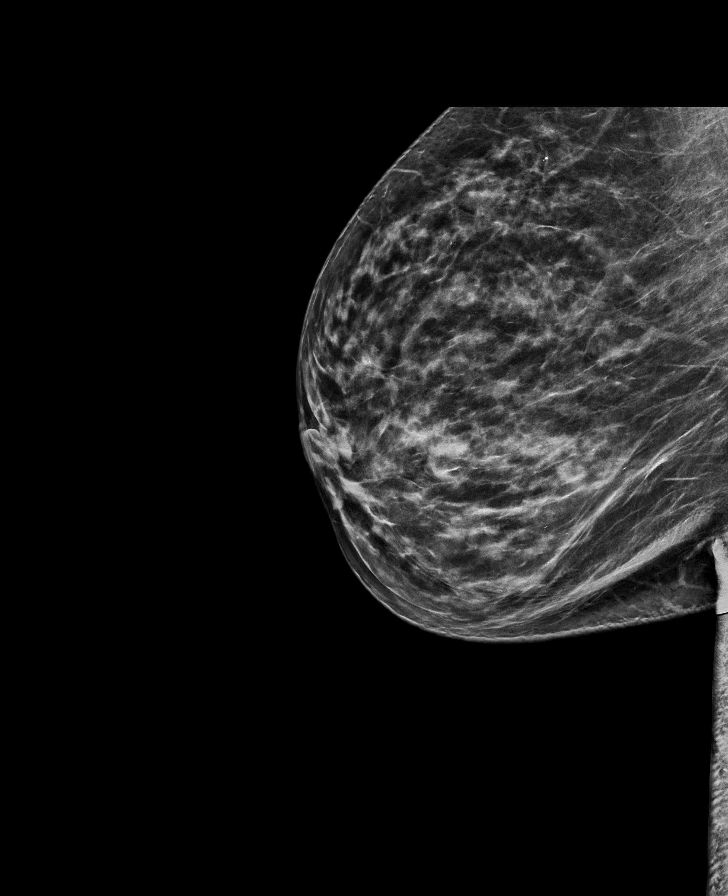

[R CC synth-2D]
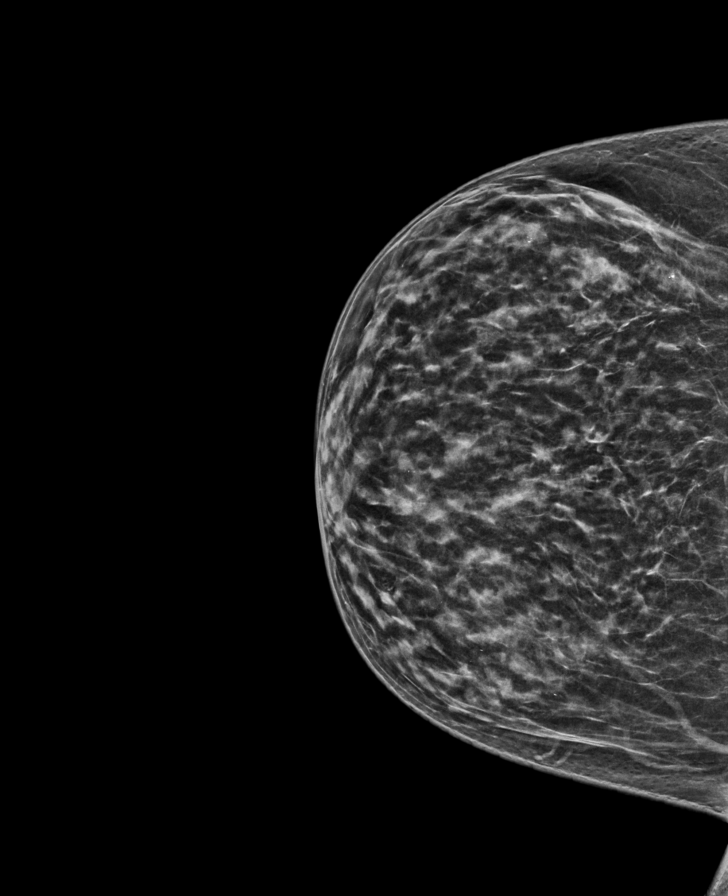

[L MLO synth-2D]
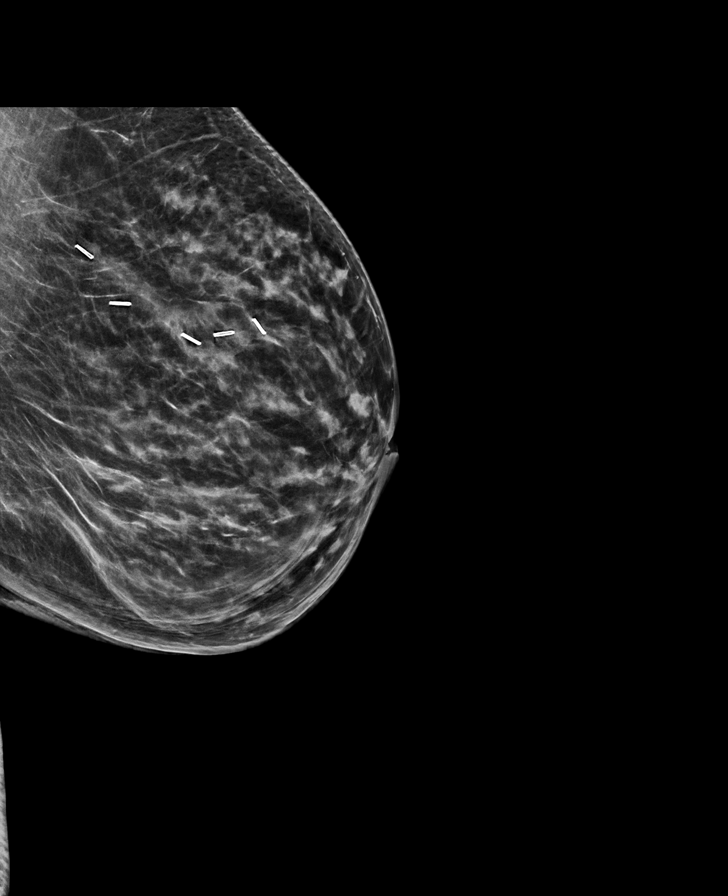

[L CC synth-2D]
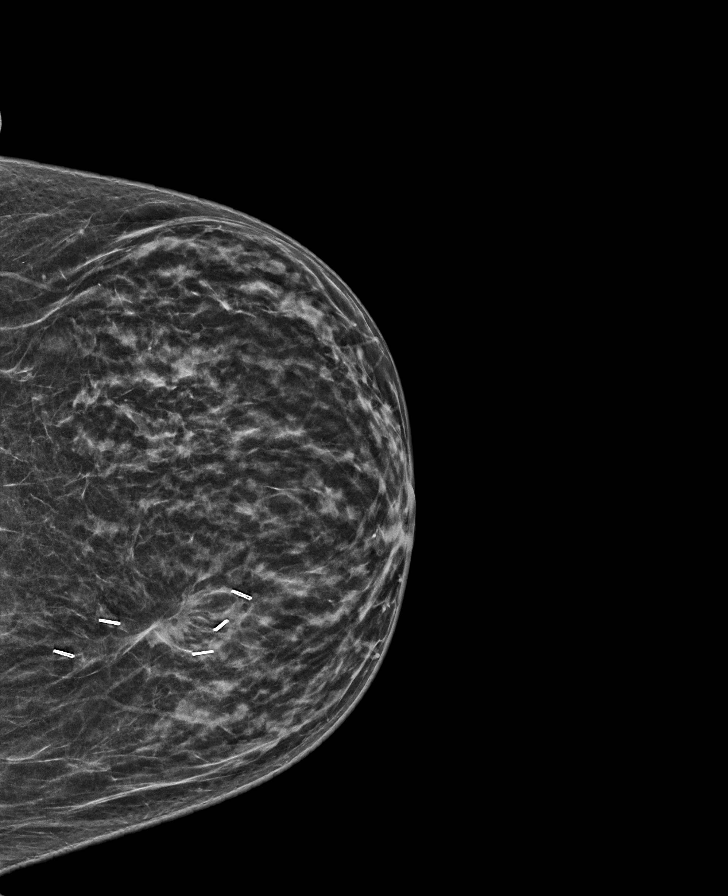

[R CC tomo · tomo slice 25/50.0]
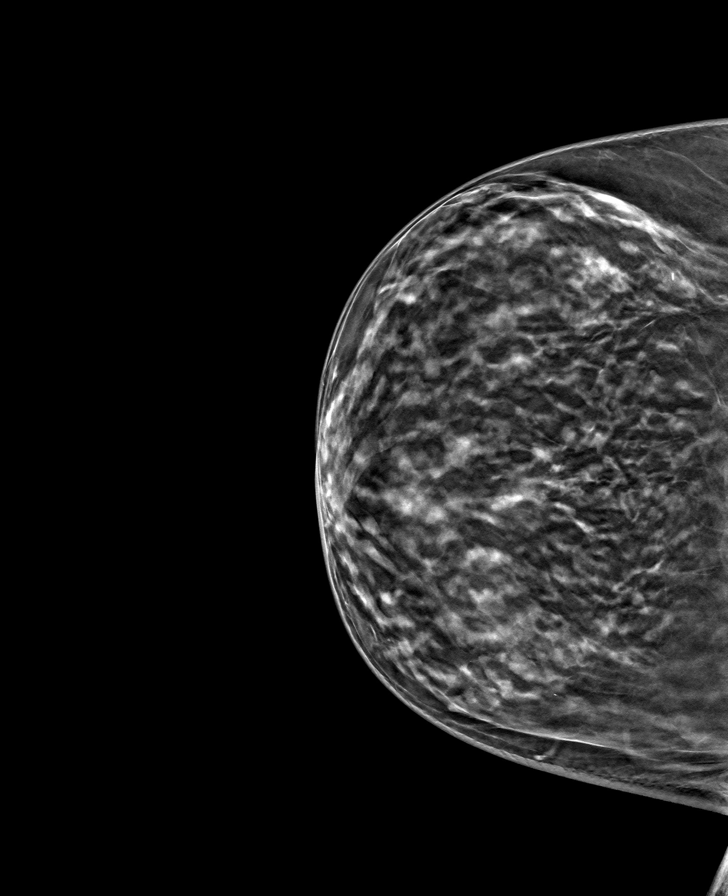

[L CC tomo · tomo slice 24/47.0]
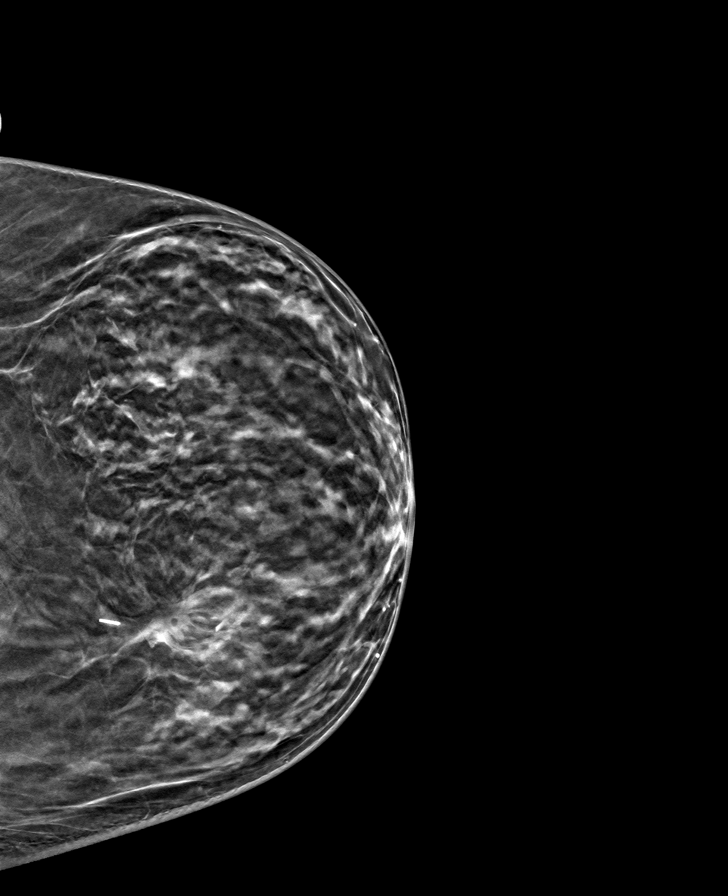

[R MLO tomo · tomo slice 31/61.0]
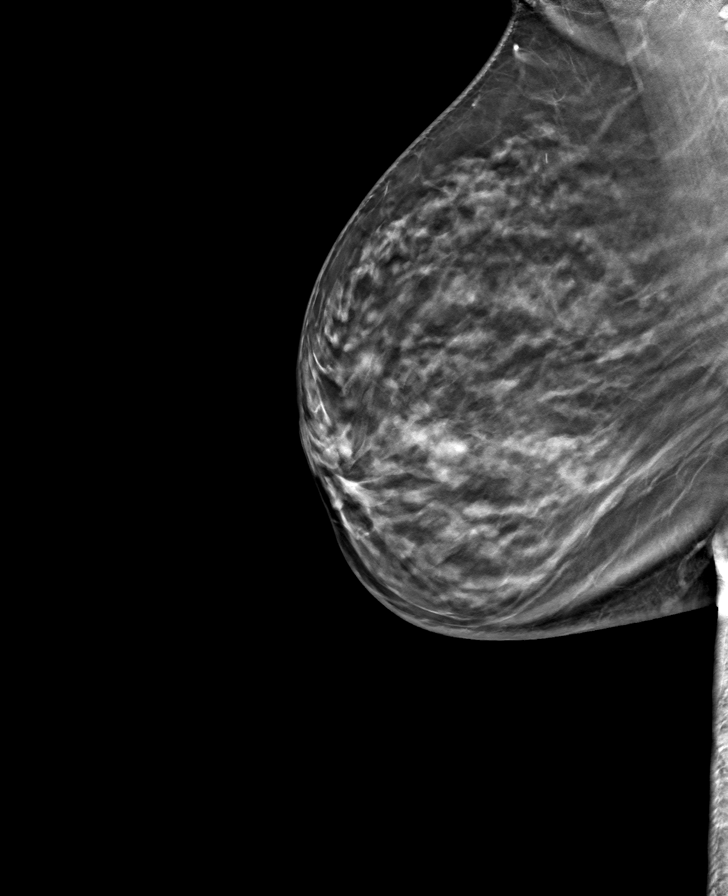

[L MLO tomo · tomo slice 30/59.0]
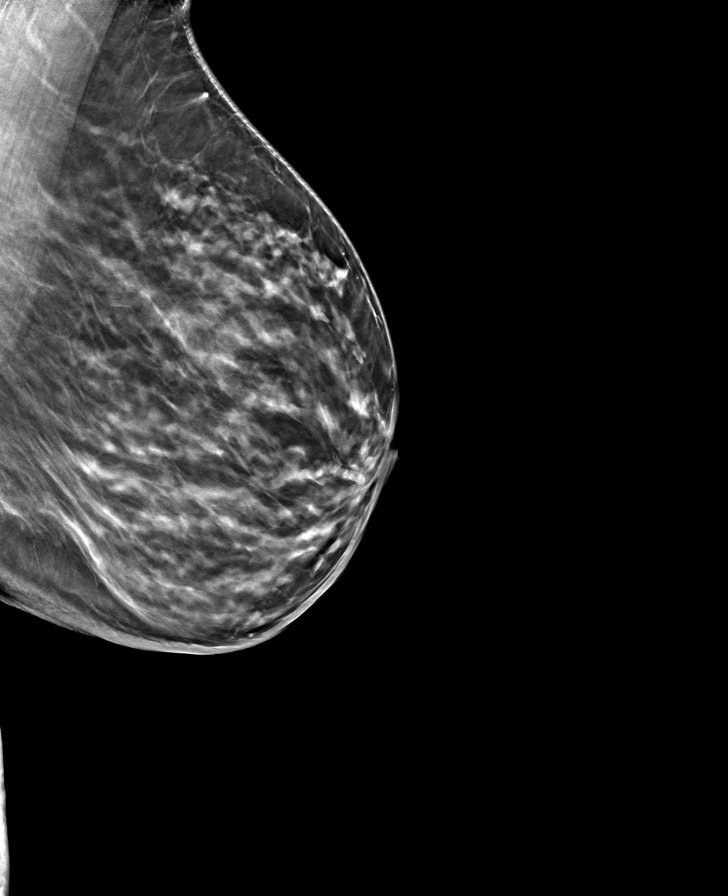

[8 of 24 positions shown; findings below may reference images not displayed]

ACR Breast Density Category c: The breast tissue is heterogeneously
dense, which may obscure small masses.
FINDINGS: There are no findings suspicious for malignancy. LEFT breast
surgical changes are again noted.
IMPRESSION: No mammographic evidence of malignancy. A result letter of this
screening mammogram will be mailed directly to the patient.

RECOMMENDATION:
Screening mammogram in one year. (Code:[id number]-[id number])

BI-RADS CATEGORY  2: Benign.

## 2024-04-24 ENCOUNTER — Other Ambulatory Visit: Payer: Self-pay | Admitting: Physician Assistant

## 2024-04-24 DIAGNOSIS — Z1231 Encounter for screening mammogram for malignant neoplasm of breast: Secondary | ICD-10-CM

## 2024-04-27 ENCOUNTER — Ambulatory Visit
Admission: RE | Admit: 2024-04-27 | Discharge: 2024-04-27 | Disposition: A | Discharge status: Home / Self Care | Source: Ambulatory Visit | Attending: Physician Assistant | Admitting: Physician Assistant

## 2024-04-27 DIAGNOSIS — Z1231 Encounter for screening mammogram for malignant neoplasm of breast: Secondary | ICD-10-CM
